# Patient Record
Sex: Male | Born: 1970 | Race: White | Hispanic: No | Marital: Single | Smoking: Never smoker
Health system: Southern US, Community
[De-identification: ages and names within clinical notes are randomized; demographics above are authoritative.]

## PROBLEM LIST (undated history)

## (undated) DIAGNOSIS — R918 Other nonspecific abnormal finding of lung field: Secondary | ICD-10-CM

## (undated) DIAGNOSIS — M109 Gout, unspecified: Secondary | ICD-10-CM

## (undated) DIAGNOSIS — Z8619 Personal history of other infectious and parasitic diseases: Secondary | ICD-10-CM

## (undated) DIAGNOSIS — J45909 Unspecified asthma, uncomplicated: Secondary | ICD-10-CM

## (undated) DIAGNOSIS — R7303 Prediabetes: Secondary | ICD-10-CM

## (undated) DIAGNOSIS — J189 Pneumonia, unspecified organism: Secondary | ICD-10-CM

## (undated) DIAGNOSIS — R3129 Other microscopic hematuria: Secondary | ICD-10-CM

## (undated) DIAGNOSIS — E291 Testicular hypofunction: Secondary | ICD-10-CM

## (undated) DIAGNOSIS — S0291XA Unspecified fracture of skull, initial encounter for closed fracture: Secondary | ICD-10-CM

## (undated) DIAGNOSIS — K219 Gastro-esophageal reflux disease without esophagitis: Secondary | ICD-10-CM

## (undated) DIAGNOSIS — Z8719 Personal history of other diseases of the digestive system: Secondary | ICD-10-CM

## (undated) DIAGNOSIS — G4730 Sleep apnea, unspecified: Secondary | ICD-10-CM

## (undated) DIAGNOSIS — S42309B Unspecified fracture of shaft of humerus, unspecified arm, initial encounter for open fracture: Secondary | ICD-10-CM

## (undated) HISTORY — DX: Gastro-esophageal reflux disease without esophagitis: K21.9

## (undated) HISTORY — DX: Gout, unspecified: M10.9

## (undated) HISTORY — DX: Unspecified asthma, uncomplicated: J45.909

## (undated) HISTORY — DX: Morbid (severe) obesity due to excess calories: E66.01

## (undated) HISTORY — DX: Pneumonia, unspecified organism: J18.9

## (undated) HISTORY — DX: Testicular hypofunction: E29.1

## (undated) HISTORY — DX: Personal history of other infectious and parasitic diseases: Z86.19

## (undated) HISTORY — DX: Other microscopic hematuria: R31.29

## (undated) HISTORY — DX: Unspecified fracture of skull, initial encounter for closed fracture: S02.91XA

## (undated) HISTORY — DX: Other nonspecific abnormal finding of lung field: R91.8

## (undated) HISTORY — DX: Unspecified fracture of shaft of humerus, unspecified arm, initial encounter for open fracture: S42.309B

---
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
## 1997-07-19 HISTORY — PX: VASECTOMY: SHX75

## 2009-07-19 DIAGNOSIS — S0291XA Unspecified fracture of skull, initial encounter for closed fracture: Secondary | ICD-10-CM

## 2009-07-19 DIAGNOSIS — S42309B Unspecified fracture of shaft of humerus, unspecified arm, initial encounter for open fracture: Secondary | ICD-10-CM

## 2009-07-19 DIAGNOSIS — R918 Other nonspecific abnormal finding of lung field: Secondary | ICD-10-CM

## 2009-07-19 HISTORY — DX: Unspecified fracture of shaft of humerus, unspecified arm, initial encounter for open fracture: S42.309B

## 2009-07-19 HISTORY — DX: Other nonspecific abnormal finding of lung field: R91.8

## 2009-07-19 HISTORY — DX: Unspecified fracture of skull, initial encounter for closed fracture: S02.91XA

## 2009-07-19 HISTORY — PX: ORIF HUMERUS FRACTURE: SHX2126

## 2011-08-05 ENCOUNTER — Encounter: Payer: Self-pay | Admitting: Family Medicine

## 2011-08-05 ENCOUNTER — Ambulatory Visit (INDEPENDENT_AMBULATORY_CARE_PROVIDER_SITE_OTHER): Payer: Commercial Managed Care - PPO | Admitting: Family Medicine

## 2011-08-05 VITALS — BP 118/84 | HR 76 | Temp 98.1°F | Ht 73.0 in | Wt 338.2 lb

## 2011-08-05 DIAGNOSIS — Z0000 Encounter for general adult medical examination without abnormal findings: Secondary | ICD-10-CM

## 2011-08-05 DIAGNOSIS — R918 Other nonspecific abnormal finding of lung field: Secondary | ICD-10-CM | POA: Insufficient documentation

## 2011-08-05 DIAGNOSIS — E669 Obesity, unspecified: Secondary | ICD-10-CM

## 2011-08-05 DIAGNOSIS — N508 Other specified disorders of male genital organs: Secondary | ICD-10-CM

## 2011-08-05 DIAGNOSIS — F5232 Male orgasmic disorder: Secondary | ICD-10-CM

## 2011-08-05 NOTE — Assessment & Plan Note (Signed)
Reviewed preventative protocols, updated unless pt declined. Encouraged seat belt use and weight loss.

## 2011-08-05 NOTE — Assessment & Plan Note (Signed)
Encouraged continued weight loss, increase exercise
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
## 2011-08-05 NOTE — Assessment & Plan Note (Signed)
As resolved, will monitor for now. Exam normal today. If returning sxs, return for further evaluation. S/p vasectomy.

## 2011-08-05 NOTE — Assessment & Plan Note (Signed)
Will obtain records from CT done at [facility].

## 2011-08-05 NOTE — Patient Instructions (Signed)
Good to meet you today. Return at your convenience fasting for blood work for physical. If having issues again, return to see [place]. Sign ROI for records from [facility]. Keep working on weight loss. Call us with questions.

## 2011-08-10 ENCOUNTER — Other Ambulatory Visit (INDEPENDENT_AMBULATORY_CARE_PROVIDER_SITE_OTHER): Payer: Commercial Managed Care - PPO

## 2011-08-10 DIAGNOSIS — Z0000 Encounter for general adult medical examination without abnormal findings: Secondary | ICD-10-CM

## 2011-08-10 LAB — COMPREHENSIVE METABOLIC PANEL
Albumin: 3.8 g/dL (ref 3.5–5.2)
BUN: 19 mg/dL (ref 6–23)
CO2: 28 mEq/L (ref 19–32)
Calcium: 9 mg/dL (ref 8.4–10.5)
Chloride: 106 mEq/L (ref 96–112)
Creatinine, Ser: 0.9 mg/dL (ref 0.4–1.5)
GFR: 94.19 mL/min (ref >60.00)
Glucose, Bld: 85 mg/dL (ref 70–99)
Potassium: 4.4 mEq/L (ref 3.5–5.1)

## 2011-08-10 LAB — LIPID PANEL
Cholesterol: 185 mg/dL (ref 0–200)
LDL Cholesterol: 116 mg/dL — ABNORMAL HIGH (ref 0–99)
Triglycerides: 110 mg/dL (ref 0.0–149.0)

## 2011-08-10 LAB — TSH: TSH: 1.68 u[IU]/mL (ref 0.35–5.50)

## 2011-09-08 ENCOUNTER — Encounter: Payer: Self-pay | Admitting: Family Medicine

## 2011-10-20 ENCOUNTER — Ambulatory Visit (INDEPENDENT_AMBULATORY_CARE_PROVIDER_SITE_OTHER): Payer: Commercial Managed Care - PPO | Admitting: Family Medicine

## 2011-10-20 ENCOUNTER — Encounter: Payer: Self-pay | Admitting: Family Medicine

## 2011-10-20 VITALS — BP 120/70 | HR 71 | Temp 98.4°F | Ht 73.0 in | Wt 333.8 lb

## 2011-10-20 DIAGNOSIS — M7020 Olecranon bursitis, unspecified elbow: Secondary | ICD-10-CM

## 2011-10-20 DIAGNOSIS — M7022 Olecranon bursitis, left elbow: Secondary | ICD-10-CM

## 2011-10-20 MED ORDER — CEPHALEXIN 500 MG PO CAPS: 1000.0000 mg | ORAL_CAPSULE | Freq: Two times a day (BID) | ORAL | Status: AC

## 2011-10-20 NOTE — Progress Notes (Signed)
  Patient Name: Brandon Parker Date of Birth: 1971/07/01 Age: 41 y.o. Medical Record Number: [id number] Gender: male Date of Encounter: 10/20/2011  History of Present Illness:  Brandon Parker is a 41 y.o. very pleasant male patient who presents with the following:  Had been sitting on couch with girlfriend. Not gotten smaller.   Left elbow 2 weeks ago became swollen posteriorly. No trauma or injury. No redness, warmth, or fever.[name]. No prior history of any significant trauma to the affected left elbow. He does have a remote history of gout per his report one time.  Past Medical History, Surgical History, Social History, Family History, Problem List, Medications, and Allergies have been reviewed and updated if relevant.  Review of Systems:  GEN: No fevers, chills. Nontoxic. Primarily MSK c/o today. MSK: Detailed in the HPI GI: tolerating PO intake without difficulty Neuro: No numbness, parasthesias, or tingling associated. Otherwise the pertinent positives of the ROS are noted above.    Physical Examination: Filed Vitals:   10/20/11 1410  BP: 120/70  Pulse: 71  Temp: 98.4 F (36.9 C)  TempSrc: Oral  Height: 6\' 1"  (1.854 m)  Weight: 333 lb 12.8 oz (151.411 kg)  SpO2: 98%    Body mass index is 44.04 kg/(m^2).   GEN: WDWN, NAD, Non-toxic, Alert & Oriented x 3 HEENT: Atraumatic, Normocephalic.  Ears and Nose: No external deformity. EXTR: No clubbing/cyanosis/edema NEURO: Normal gait.  PSYCH: Normally interactive. Conversant. Not depressed or anxious appearing.  Calm demeanor.   L elbow Ecchymosis or edema: neg ROM: full flexion, extension, pronation, supination Shoulder ROM: Full Flexion: 5/5 Extension: 5/5 Supination: 5/5  Pronation: 5/5 Wrist ext: 5/5 Wrist flexion: 5/5 No gross bony abnormality Swollen olecranon bursa Lateral epicondyle, resisted wrist extension from wrist full pronation and flexion: NT grip: 5/5  sensation intact Tinel's, Elbow:  negative   Assessment and Plan: 1. Olecranon bursitis of left elbow     Olecranon Bursa Aspiration, LEFT Verbal consent was obtained. Risks, benefits, and alternatives discussed. Potential complications including loss of pigment and atrophy were discussed and risk of infection. Discussed with the patient that risk of infection is higher with injection of corticosteroid into this superficial bursa. Prepped with Chloraprep x 2 and Ethyl Chloride used for anesthesia. Under sterile conditions, the effected olecranon bursa was aspirated with an 18 gauge needle. 10 cc of serosanguinous fluid was aspirated. 1/2 cc Lidocaine 1% and 1/2 cc of Depo-Medrol 40 mg was injected  A compression bandage was applied and the patient was instructed to continue for 72 hours.  Proph Keflex x 7 days given higher risk of infection with this aspiration

## 2011-12-09 ENCOUNTER — Encounter: Payer: Self-pay | Admitting: Family Medicine

## 2011-12-09 ENCOUNTER — Other Ambulatory Visit: Payer: Self-pay | Admitting: Family Medicine

## 2011-12-09 ENCOUNTER — Ambulatory Visit (INDEPENDENT_AMBULATORY_CARE_PROVIDER_SITE_OTHER): Payer: Commercial Managed Care - PPO | Admitting: Family Medicine

## 2011-12-09 VITALS — BP 112/70 | HR 68 | Temp 98.5°F | Wt 318.8 lb

## 2011-12-09 DIAGNOSIS — M7020 Olecranon bursitis, unspecified elbow: Secondary | ICD-10-CM

## 2011-12-09 DIAGNOSIS — M7022 Olecranon bursitis, left elbow: Secondary | ICD-10-CM

## 2011-12-09 NOTE — Patient Instructions (Addendum)
Olecranon bursa drained today, and steroid injected to hopefully prevent recurrence. Good to see you today, call us with questions. Compression bandage on for 3 days, limit activity of that elbow. Let us know right away if any fever, redness or warmth.  Olecranon Bursitis Bursitis is swelling and soreness (inflammation) of a fluid-filled sac (bursa) that covers and protects a joint. Olecranon bursitis occurs over the elbow.  CAUSES Bursitis can be caused by injury, overuse of the joint, arthritis, or infection.  SYMPTOMS   Tenderness, swelling, warmth, or redness over the elbow.   Elbow pain with movement. This is greater with bending the elbow.   Squeaking sound when the bursa is rubbed or moved.   Increasing size of the bursa without pain or discomfort.   Fever with increasing pain and swelling if the bursa becomes infected.  HOME CARE INSTRUCTIONS   Put ice on the affected area.   Put ice in a plastic bag.   Place a towel between your skin and the bag.   Leave the ice on for 15 to 20 minutes each hour while awake. Do this for the first 2 days.   When resting, elevate your elbow above the level of your heart. This helps reduce swelling.   Continue to put the joint through a full range of motion 4 times per day. Rest the injured joint at other times. When the pain lessens, begin normal slow movements and usual activities.   Only take over-the-counter or prescription medicines for pain, discomfort, or fever as directed by your caregiver.   Reduce your intake of milk and related dairy products (cheese, yogurt). They may make your condition worse.  SEEK IMMEDIATE MEDICAL CARE IF:   Your pain increases even during treatment.   You have a fever.   You have heat and inflammation over the bursa and elbow.   You have a red line that goes up your arm.   You have pain with movement of your elbow.  MAKE SURE YOU:   Understand these instructions.   Will watch your condition.     Will get help right away if you are not doing well or get worse.  Document Released: 08/04/2006 Document Revised: 06/24/2011 Document Reviewed: 06/20/2007 [facility] Patient Information 2012 [place], [place].

## 2011-12-09 NOTE — Assessment & Plan Note (Signed)
Serosanguinous fluid - not sent off for analysis.  anticipate traumatic bursitis. Aspiration and injection of steroid/lidocaine performed today. Pt tolerated well. Discussed risks of infection with steroid injection as well as red flags to seek urgent care. If not better, consider ortho referral for surgery.

## 2011-12-09 NOTE — Progress Notes (Signed)
  Subjective:    Patient ID: Brandon Parker, male    DOB: 04-29-1971, 41 y.o.   MRN: [id number]  HPI CC: olecranon bursitis  More strenuous work this past weekend, may have hit elbow on furniture and care.  Developed fluid in bursa over last 5 days again.  More swollen than last time.  No pain.  No warmth, redness, no fevers/chills.  Aspiration and steroid injection performed 10/20/2011 by Dr. Salena Saner.  Kept compression bandage on for 4 days.  Never had bursitis prior to April.  Review of Systems Per HPi    Objective:   Physical Exam  Nursing note and vitals reviewed. Musculoskeletal:       Left swollen olecranon bursa.  No warmth or erythema or pain.  FROM at elbow   IC obtained and in chart.  Area prepped with betadine, anesthesia with ethyl chloride.  1.5 inch 21g needle attached to 10cc syringe used to aspirate fluid.  7cc of serosanguinous appearing fluid aspirated, not cloudy.  Then using 10cc syringe and same needle, injected 1/2 cc of Depo-Medrol 40 mg and 1/2 cc of 1% lidocaine.  Pt tolerated procedure well.  Dressed in compression bandage.    Assessment & Plan:

## 2011-12-21 ENCOUNTER — Ambulatory Visit (INDEPENDENT_AMBULATORY_CARE_PROVIDER_SITE_OTHER): Payer: Commercial Managed Care - PPO | Admitting: Family Medicine

## 2011-12-21 ENCOUNTER — Encounter: Payer: Self-pay | Admitting: Family Medicine

## 2011-12-21 VITALS — BP 110/78 | HR 89 | Temp 98.1°F | Wt 315.8 lb

## 2011-12-21 DIAGNOSIS — J45909 Unspecified asthma, uncomplicated: Secondary | ICD-10-CM

## 2011-12-21 DIAGNOSIS — J209 Acute bronchitis, unspecified: Secondary | ICD-10-CM | POA: Insufficient documentation

## 2011-12-21 MED ORDER — PREDNISONE 20 MG PO TABS: ORAL_TABLET | ORAL | Status: DC

## 2011-12-21 MED ORDER — DEXAMETHASONE SODIUM PHOSPHATE 10 MG/ML IJ SOLN
10.0000 mg | Freq: Once | INTRAMUSCULAR | Status: AC
  Administered 2011-12-21: 10 mg via INTRAMUSCULAR

## 2011-12-21 MED ORDER — ALBUTEROL SULFATE HFA 108 (90 BASE) MCG/ACT IN AERS: 2.0000 | INHALATION_SPRAY | Freq: Four times a day (QID) | RESPIRATORY_TRACT | Status: DC | PRN

## 2011-12-21 MED ORDER — AZITHROMYCIN 250 MG PO TABS: ORAL_TABLET | ORAL | Status: AC

## 2011-12-21 NOTE — Progress Notes (Signed)
  Subjective:    Patient ID: Brandon Parker, male    DOB: [birth year], 41 y.o.   MRN: [id number]  HPI CC: cough  1 d h/o cough, congestion. Coughing up clear mucous.  Nonstop cough for 1 hour last night.  Now very congested in sinuses.  Trouble breathing.  Feels some feverish.  + frontal HA described as pressure.  Some PNdrainage.   + chest tightness.  Inhaler in past hasn't helped.  Took OTC sinutab, nyquil.  No fevers/chills, abd pain, n/v, ear/tooth pain, ST.  Denies chest pain.    No sick contacts at home.  No smokers at home.  Has been told has asthma in past but only with colds.  eval by pulm, who didn't think he had asthma.  This evaluation was 10-12 years ago.  3-4 wks ago used bleach to clean kitchen floor.  Wt Readings from Last 3 Encounters:  12/21/11 315 lb 12 oz (143.223 kg)  12/09/11 318 lb 12 oz (144.584 kg)  10/20/11 333 lb 12.8 oz (151.411 kg)    Past Medical History  Diagnosis Date  . History of chicken pox   . Esophagitis 1992  . Fracture, humerus, open 2011  . Skull fracture 2011  . Obesity   . Pulmonary nodules 2011    per CT scan [facility] - reviewed records and no mention of this    Review of Systems Per HPI    Objective:   Physical Exam  Nursing note and vitals reviewed. Constitutional: He appears well-developed and well-nourished. No distress.  HENT:  Head: Normocephalic and atraumatic.  Right Ear: Hearing, tympanic membrane, external ear and ear canal normal.  Left Ear: Hearing, external ear and ear canal normal.  Nose: No mucosal edema or rhinorrhea. Right sinus exhibits no maxillary sinus tenderness and no frontal sinus tenderness. Left sinus exhibits no maxillary sinus tenderness and no frontal sinus tenderness.  Mouth/Throat: Uvula is midline and mucous membranes are normal. Posterior oropharyngeal erythema present. No oropharyngeal exudate, posterior oropharyngeal edema or tonsillar abscesses.       L superior TM erythematous, retracted, some  fluid behind TM 2+ tonsillar swelling present bilaterally  Eyes: Conjunctivae and EOM are normal. Pupils are equal, round, and reactive to light. No scleral icterus.  Neck: Normal range of motion. Neck supple.  Cardiovascular: Normal rate, regular rhythm, normal heart sounds and intact distal pulses.   No murmur heard. Pulmonary/Chest: Effort normal. No respiratory distress. He has wheezes. He has no rales.       Rhonchi and wheezing inspiratory/expiratory greatest at RLL.  However most of wheezing stems from upper airway transmission. Cough present  Musculoskeletal: He exhibits no edema.  Lymphadenopathy:    He has no cervical adenopathy.  Skin: Skin is warm and dry. No rash noted.  Psychiatric: He has a normal mood and affect.      Assessment & Plan:

## 2011-12-21 NOTE — Assessment & Plan Note (Signed)
Anticipate asthmatic bronchitis with significant upper airway transmission of wheezing from some pharyngeal erythema/edema. Significant RAD component, recent bleach exposure may be contributing. Also with what looks like left serous otitis without symptoms currently. Treat aggressively with shot of dexamethasone 10mg  IM today, prednisone course, albuterol inhaler to use (discussd technique) and zpack. Discussed steroid precautions. Take mucinex at home. If not improving or any worsening, to return here or seek urgent care.

## 2011-12-21 NOTE — Patient Instructions (Signed)
You have asthmatic bronchitis, or at least significant airway reactivity with upper respiratory infection. Shot of steroids today.  Take prednisone course starting tonight for 10 days.  zpack for 5 days and albuterol as needed. Simple mucinex with plenty of fluid to mobilize mucous out. If not improving as expected, or any worsening, please seek urgent care or return to see [place].

## 2012-07-28 ENCOUNTER — Encounter: Payer: Self-pay | Admitting: Family Medicine

## 2012-07-28 ENCOUNTER — Ambulatory Visit (INDEPENDENT_AMBULATORY_CARE_PROVIDER_SITE_OTHER): Payer: Commercial Managed Care - PPO | Admitting: Family Medicine

## 2012-07-28 VITALS — BP 126/82 | HR 84 | Temp 98.2°F | Wt 334.2 lb

## 2012-07-28 DIAGNOSIS — R062 Wheezing: Secondary | ICD-10-CM

## 2012-07-28 DIAGNOSIS — J019 Acute sinusitis, unspecified: Secondary | ICD-10-CM

## 2012-07-28 MED ORDER — AMOXICILLIN-POT CLAVULANATE 875-125 MG PO TABS: 1.0000 | ORAL_TABLET | Freq: Two times a day (BID) | ORAL | Status: AC

## 2012-07-28 MED ORDER — FLUTICASONE PROPIONATE 50 MCG/ACT NA SUSP: 2.0000 | Freq: Every day | NASAL | Status: DC

## 2012-07-28 NOTE — Progress Notes (Signed)
  Subjective:    Patient ID: Brandon Parker, male    DOB: 1971/07/14, 42 y.o.   MRN: [id number]  HPI CC: sinus drainage  6-7d h/o sinus drainage, PNdrainage.  + sinus pressure HA frontally and under eyes.  Constantly coughing 2/2 drainage.  Yesterday when awoke, noticed phlegm turning darker.  Every morning coughing and bringing productive mucous up.  + nasal congestion.    So far has tried antihistamine.  No fevers/chills, abd pain, ST, ear or tooth pain.  No SOB.  No sick contacts at home. No h/o asthma/allergic rhinitis.  Has been told has upper airway related wheezing in past.  Wheezing not responsive to bronchodilator. No smokers at home.  Past Medical History  Diagnosis Date  . History of chicken pox   . Esophagitis 1992  . Fracture, humerus, open 2011  . Skull fracture 2011  . Obesity   . Pulmonary nodules 2011    per CT scan [facility] - reviewed records and no mention of this     Review of Systems Per HPI    Objective:   Physical Exam  Nursing note and vitals reviewed. Constitutional: He appears well-developed and well-nourished. No distress.  HENT:  Head: Normocephalic and atraumatic.  Right Ear: Hearing, tympanic membrane, external ear and ear canal normal.  Left Ear: Hearing, tympanic membrane, external ear and ear canal normal.  Nose: Mucosal edema present. No rhinorrhea. Right sinus exhibits no maxillary sinus tenderness and no frontal sinus tenderness. Left sinus exhibits no maxillary sinus tenderness and no frontal sinus tenderness.  Mouth/Throat: Uvula is midline and mucous membranes are normal. Posterior oropharyngeal erythema present. No oropharyngeal exudate, posterior oropharyngeal edema or tonsillar abscesses.       Nasal sinus congestion  Eyes: Conjunctivae normal and EOM are normal. Pupils are equal, round, and reactive to light. No scleral icterus.  Neck: Normal range of motion. Neck supple.  Cardiovascular: Normal rate, regular rhythm, normal heart  sounds and intact distal pulses.   No murmur heard. Pulmonary/Chest: Effort normal. No respiratory distress. He has wheezes (insp/exp with some upper airway transmission). He has no rales.  Lymphadenopathy:    He has no cervical adenopathy.  Skin: Skin is warm and dry. No rash noted.       Assessment & Plan:

## 2012-07-28 NOTE — Assessment & Plan Note (Signed)
Per pt no h/o asthma, has been evaluated by pulm in past, told soft palate issue - upper airway that should improve with weight loss. Denies SOB, states wheezing not responsive to bronchodilator in past. There is some wheezing in lungs today - did recommend start albuterol regularly for next 3 days.

## 2012-07-28 NOTE — Assessment & Plan Note (Signed)
Given short duration discussed possible still viral etiology. Supportive care as per instructions. Treat with mucinex, nasal saline, nasal steroid, and albuterol. Discussed reasons to fill abx.

## 2012-07-28 NOTE — Patient Instructions (Addendum)
You have a sinus infection, likely still viral. Take medicine as prescribed: flonase for inflammation in sinuses.  May use ibuprofen as well for sinus inflammation and headache. Push fluids and plenty of rest. Nasal saline irrigation or neti pot to help drain sinuses. May use simple mucinex with plenty of fluid to help mobilize mucous. If not improving over weekend or any worsening, fill antibiotic (script sent into pharmacy). Use albuterol regularly for next 3 days Let us know if fever >101.5, trouble opening/closing mouth, difficulty swallowing, or worsening - you may need to be seen again.

## 2012-08-04 ENCOUNTER — Telehealth: Payer: Self-pay | Admitting: Family Medicine

## 2012-08-04 MED ORDER — GUAIFENESIN-CODEINE 100-10 MG/5ML PO SYRP: 5.0000 mL | ORAL_SOLUTION | Freq: Two times a day (BID) | ORAL | Status: DC | PRN

## 2012-08-04 MED ORDER — PREDNISONE 20 MG PO TABS: ORAL_TABLET | ORAL | Status: DC

## 2012-08-04 NOTE — Telephone Encounter (Signed)
Pt was seen last week for sinus infection.  Started Augmentin on Sunday but is now experiencing worsened congestion and cough.  No fever.  Please advise.

## 2012-08-04 NOTE — Telephone Encounter (Signed)
Spoke with patient - started feeling better over this week after starting augmentin course.  Over last 2-3 days having worsening cough/congestion - rib pain from coughing.  Improved head congestion. Wheezing and SOB about the same. Will treat with prednisone course - concern for asthmatic bronchitis. Selena Batten can we call in cough med for pt? Thanks.

## 2012-08-04 NOTE — Telephone Encounter (Signed)
Rx called in as directed.   

## 2012-08-28 ENCOUNTER — Telehealth: Payer: Self-pay | Admitting: *Deleted

## 2012-08-28 ENCOUNTER — Ambulatory Visit (INDEPENDENT_AMBULATORY_CARE_PROVIDER_SITE_OTHER): Payer: Commercial Managed Care - PPO | Admitting: Family Medicine

## 2012-08-28 ENCOUNTER — Encounter: Payer: Self-pay | Admitting: Family Medicine

## 2012-08-28 VITALS — BP 136/82 | HR 80 | Temp 98.3°F | Wt 340.5 lb

## 2012-08-28 DIAGNOSIS — M109 Gout, unspecified: Secondary | ICD-10-CM | POA: Insufficient documentation

## 2012-08-28 MED ORDER — COLCHICINE 0.6 MG PO TABS: 0.6000 mg | ORAL_TABLET | Freq: Two times a day (BID) | ORAL | Status: DC | PRN

## 2012-08-28 NOTE — Telephone Encounter (Signed)
Opened in error

## 2012-08-28 NOTE — Assessment & Plan Note (Signed)
Podagra. Treat with colchicine as directed in instructions. Continue ibuprofen as needed. If recurrent, discussed ppx med. Provided with gout handout. Lab Results  Component Value Date   CREATININE 0.9 08/10/2011

## 2012-08-28 NOTE — Progress Notes (Signed)
  Subjective:    Patient ID: Brandon Parker, male    DOB: [birth year], 42 y.o.   MRN: [id number]  HPI CC: ?gout  States h/o gout in past in his twenties.  2d h/o R 1st MTP joint pain. Progressively enlarging and more sore, at night tender to rub bed sheet against toe.   This feels just like prior gout flare.  Started drinking cherry juice, using ibuprofen, changed diet from red meat to chicken.  Denies fevers/chills, nausea.  No rashes he's noted. Denies ankle or knee or other joint pains. H/o achilles tendinopathy. No results found for this basename: URICACID   Did drink more recently.  Has been going to [employer].  Past Medical History  Diagnosis Date  . History of chicken pox   . Esophagitis 1992  . Fracture, humerus, open 2011  . Skull fracture 2011  . Obesity   . Pulmonary nodules 2011    per CT scan [facility] - reviewed records and no mention of this  . Gout      Review of Systems Per HPI    Objective:   Physical Exam  Nursing note and vitals reviewed. Constitutional: He appears well-developed and well-nourished. No distress.  Musculoskeletal: He exhibits no edema.  L MTP WNL R foot - swelling and pain and redness at 1st MTPJ FROM at ankle. 2+ DP bilaterally  Skin: Skin is warm and dry. There is erythema.       Assessment & Plan:

## 2012-08-28 NOTE — Patient Instructions (Signed)
You do have gout flare - treat with ibuprofen and colchicine - take 2 colchicine pills today and then one twice daily as needed, likely no more than 3-4 days. If recurrent gout flares, let me know to discusss daily gout prevention medication.  Gout Gout is an inflammatory condition (arthritis) caused by a buildup of uric acid crystals in the joints. Uric acid is a chemical that is normally present in the blood. Under some circumstances, uric acid can form into crystals in your joints. This causes joint redness, soreness, and swelling (inflammation). Repeat attacks are common. Over time, uric acid crystals can form into masses (tophi) near a joint, causing disfigurement. Gout is treatable and often preventable. CAUSES  The disease begins with elevated levels of uric acid in the blood. Uric acid is produced by your body when it breaks down a naturally found substance called purines. This also happens when you eat certain foods such as meats and fish. Causes of an elevated uric acid level include:  Being passed down from parent to child (heredity).  Diseases that cause increased uric acid production (obesity, psoriasis, some cancers).  Excessive alcohol use.  Diet, especially diets rich in meat and seafood.  Medicines, including certain cancer-fighting drugs (chemotherapy), diuretics, and aspirin.  Chronic kidney disease. The kidneys are no longer able to remove uric acid well.  Problems with metabolism. Conditions strongly associated with gout include:  Obesity.  High blood pressure.  High cholesterol.  Diabetes. Not everyone with elevated uric acid levels gets gout. It is not understood why some people get gout and others do not. Surgery, joint injury, and eating too much of certain foods are some of the factors that can lead to gout. SYMPTOMS   An attack of gout comes on quickly. It causes intense pain with redness, swelling, and warmth in a joint.  Fever can occur.  Often, only  one joint is involved. Certain joints are more commonly involved:  Base of the big toe.  Knee.  Ankle.  Wrist.  Finger. Without treatment, an attack usually goes away in a few days to weeks. Between attacks, you usually will not have symptoms, which is different from many other forms of arthritis. DIAGNOSIS  Your caregiver will suspect gout based on your symptoms and exam. Removal of fluid from the joint (arthrocentesis) is done to check for uric acid crystals. Your caregiver will give you a medicine that numbs the area (local anesthetic) and use a needle to remove joint fluid for exam. Gout is confirmed when uric acid crystals are seen in joint fluid, using a special microscope. Sometimes, blood, urine, and X-ray tests are also used. TREATMENT  There are 2 phases to gout treatment: treating the sudden onset (acute) attack and preventing attacks (prophylaxis). Treatment of an Acute Attack  Medicines are used. These include anti-inflammatory medicines or steroid medicines.  An injection of steroid medicine into the affected joint is sometimes necessary.  The painful joint is rested. Movement can worsen the arthritis.  You may use warm or cold treatments on painful joints, depending which works best for you.  Discuss the use of coffee, vitamin C, or cherries with your caregiver. These may be helpful treatment options. Treatment to Prevent Attacks After the acute attack subsides, your caregiver may advise prophylactic medicine. These medicines either help your kidneys eliminate uric acid from your body or decrease your uric acid production. You may need to stay on these medicines for a very long time. The early phase of treatment with  prophylactic medicine can be associated with an increase in acute gout attacks. For this reason, during the first few months of treatment, your caregiver may also advise you to take medicines usually used for acute gout treatment. Be sure you understand your  caregiver's directions. You should also discuss dietary treatment with your caregiver. Certain foods such as meats and fish can increase uric acid levels. Other foods such as dairy can decrease levels. Your caregiver can give you a list of foods to avoid. HOME CARE INSTRUCTIONS   Do not take aspirin to relieve pain. This raises uric acid levels.  Only take over-the-counter or prescription medicines for pain, discomfort, or fever as directed by your caregiver.  Rest the joint as much as possible. When in bed, keep sheets and blankets off painful areas.  Keep the affected joint raised (elevated).  Use crutches if the painful joint is in your leg.  Drink enough water and fluids to keep your urine clear or pale yellow. This helps your body get rid of uric acid. Do not drink alcoholic beverages. They slow the passage of uric acid.  Follow your caregiver's dietary instructions. Pay careful attention to the amount of protein you eat. Your daily diet should emphasize fruits, vegetables, whole grains, and fat-free or low-fat milk products.  Maintain a healthy body weight. SEEK MEDICAL CARE IF:   You have an oral temperature above 102 F (38.9 C).  You develop diarrhea, vomiting, or any side effects from medicines.  You do not feel better in 24 hours, or you are getting worse. SEEK IMMEDIATE MEDICAL CARE IF:   Your joint becomes suddenly more tender and you have:  Chills.  An oral temperature above 102 F (38.9 C), not controlled by medicine. MAKE SURE YOU:   Understand these instructions.  Will watch your condition.  Will get help right away if you are not doing well or get worse. Document Released: 07/02/2000 Document Revised: 09/27/2011 Document Reviewed: 10/13/2009 [facility] Patient Information 2013 [place], [place].

## 2013-01-31 ENCOUNTER — Ambulatory Visit (INDEPENDENT_AMBULATORY_CARE_PROVIDER_SITE_OTHER): Payer: Commercial Managed Care - PPO | Admitting: Family Medicine

## 2013-01-31 ENCOUNTER — Encounter: Payer: Self-pay | Admitting: Family Medicine

## 2013-01-31 VITALS — BP 118/82 | HR 72 | Temp 97.9°F | Wt 356.8 lb

## 2013-01-31 DIAGNOSIS — J209 Acute bronchitis, unspecified: Secondary | ICD-10-CM

## 2013-01-31 DIAGNOSIS — W57XXXA Bitten or stung by nonvenomous insect and other nonvenomous arthropods, initial encounter: Secondary | ICD-10-CM

## 2013-01-31 MED ORDER — PREDNISONE 20 MG PO TABS: ORAL_TABLET | ORAL | Status: DC

## 2013-01-31 NOTE — Progress Notes (Signed)
  Subjective:    Patient ID: Brandon Parker, male    DOB: 01-Dec-1970, 42 y.o.   MRN: [id number]  HPI CC: ER f/u  Found large tick embedded in scrotum 01/17/2013.  Self removed.  Next day, felt dry and sore roof of mouth.  On Friday (01/19/2013) started having congestion, chills, dizziness.  Felt very weak.  Evaluated at [facility], placed on 2 wk course of doxycycline 01/19/2013.  Since then, having headache, nausea, dizziness and significant congestion with myalgia, fatigue.  Feels tight in neck and shoulders.  Cough productive of congestion.  Intermittent sore throat.  Denies wheeze or short winded.    Not currently using albuterol - didn't really seem to help.   Has taken oral steroids in past.  No fever.  No persistent rash.  No joint pains.  No ear pain or PNdrainage.   No sick contacts at home. No smokers at home. Did have recent mosquito bites.  Cheratussin helps with cough.  Past Medical History  Diagnosis Date  . History of chicken pox   . Esophagitis 1992  . Fracture, humerus, open 2011  . Skull fracture 2011  . Obesity   . Pulmonary nodules 2011    per CT scan [facility] - reviewed records and no mention of this  . Gout     Review of Systems Per HPI    Objective:   Physical Exam  Nursing note and vitals reviewed. Constitutional: He appears well-developed and well-nourished. No distress.  HENT:  Head: Normocephalic and atraumatic.  Right Ear: Hearing, tympanic membrane, external ear and ear canal normal.  Left Ear: Hearing, tympanic membrane, external ear and ear canal normal.  Nose: Nose normal. No mucosal edema or rhinorrhea. Right sinus exhibits no maxillary sinus tenderness and no frontal sinus tenderness. Left sinus exhibits no maxillary sinus tenderness and no frontal sinus tenderness.  Mouth/Throat: Uvula is midline, oropharynx is clear and moist and mucous membranes are normal. No oropharyngeal exudate, posterior oropharyngeal edema, posterior oropharyngeal  erythema or tonsillar abscesses.  Eyes: Conjunctivae and EOM are normal. Pupils are equal, round, and reactive to light. No scleral icterus.  Neck: Normal range of motion. Neck supple.  Cardiovascular: Normal rate, regular rhythm, normal heart sounds and intact distal pulses.   No murmur heard. Pulmonary/Chest: Effort normal. No respiratory distress. He has wheezes (diffuse melodic insp/exp wheezing). He has no rhonchi. He has no rales.  Abdominal: Soft. Normal appearance and bowel sounds are normal. He exhibits no distension and no mass. There is no hepatosplenomegaly. There is no tenderness. There is no rigidity, no rebound, no guarding, no CVA tenderness and negative Murphy's sign.  Musculoskeletal: He exhibits no edema.  Lymphadenopathy:    He has no cervical adenopathy.  Skin: Skin is warm and dry. No rash noted.       Assessment & Plan:

## 2013-01-31 NOTE — Assessment & Plan Note (Signed)
Significant RAD component - treat with steroid course and albuterol scheduled for next 1-2 days. Doubt further infection so will not add second antibiotic.  Recommended finish doxycycline. Use guaifenesin OTC as well. Continue cheratussin. If not improving or any worsening, return for further evaluation.

## 2013-01-31 NOTE — Assessment & Plan Note (Signed)
Recent tick bite - currently healing well.  S/p 2wk treatment with doxy per ED physician at [facility].   Likely is covered for any tick borne illness with this treatment.  reassured.

## 2013-01-31 NOTE — Patient Instructions (Signed)
I think we have any possible tick borne illness covered with doxycycline 2 weeks. You have significant wheezing in lungs - treat with steroid course as prescribed. May continue cheratussin as needed. Start albuterol inhaler 2 puffs regularly twice daily for next 2 days to see if improvement in cough. May use plain mucinex or immediate release guaifenesin with lots of water to help mobilize mucous. Let me know if fever develops >101 or worsening productive cough despite above treatment

## 2013-07-19 DIAGNOSIS — E291 Testicular hypofunction: Secondary | ICD-10-CM

## 2013-07-19 HISTORY — DX: Testicular hypofunction: E29.1

## 2013-09-10 ENCOUNTER — Ambulatory Visit (INDEPENDENT_AMBULATORY_CARE_PROVIDER_SITE_OTHER)
Admission: RE | Admit: 2013-09-10 | Discharge: 2013-09-10 | Disposition: A | Discharge status: Home / Self Care | Payer: Commercial Managed Care - PPO | Source: Ambulatory Visit | Attending: Family Medicine | Admitting: Family Medicine

## 2013-09-10 ENCOUNTER — Ambulatory Visit (INDEPENDENT_AMBULATORY_CARE_PROVIDER_SITE_OTHER): Payer: Commercial Managed Care - PPO | Admitting: Family Medicine

## 2013-09-10 ENCOUNTER — Encounter: Payer: Self-pay | Admitting: Family Medicine

## 2013-09-10 VITALS — BP 122/80 | HR 72 | Temp 98.0°F | Wt 382.8 lb

## 2013-09-10 DIAGNOSIS — R635 Abnormal weight gain: Secondary | ICD-10-CM

## 2013-09-10 DIAGNOSIS — J209 Acute bronchitis, unspecified: Secondary | ICD-10-CM

## 2013-09-10 MED ORDER — PREDNISONE 20 MG PO TABS: ORAL_TABLET | ORAL | Status: DC

## 2013-09-10 MED ORDER — AZITHROMYCIN 250 MG PO TABS
ORAL_TABLET | ORAL | Status: DC
Start: 2013-09-10 — End: 2013-11-29

## 2013-09-10 NOTE — Progress Notes (Signed)
Pre visit review using our clinic review tool, if applicable. No additional management support is needed unless otherwise documented below in the visit note. 

## 2013-09-10 NOTE — Progress Notes (Signed)
BP 122/80  Pulse 72  Temp(Src) 98 F (36.7 C) (Oral)  Wt 382 lb 12 oz (173.614 kg)  SpO2 97%   CC: cough  Subjective:    Patient ID: Brandon Parker, male    DOB: October 02, 1970, 43 y.o.   MRN: [id number]  HPI: Brandon Parker is a 43 y.o. male presenting on 09/10/2013 with Cough   5wk h/o intermittent coughing episodes.  Started with fevers/chills, bad hacking cough, sore throat, wheezing and dypsnea for 2 weeks.  Hoarseness.  Albuterol only mildly helped.  Coughing fits with trouble catching breath.  Increased wheezing and dyspnea from baseline. Had a good weekend, then 2d ago started having recurrent coughing fit.  May have been exposed to mold when this coughing fit started. Cough present especially every morning after he awakens and takes hot shower. Breathing cold air improves his breathing.  Does well during the day, then cough returns at night and whenever he comes into a warm environment. Tickle in back of throat.  No current fevers/chills, abd pain, nausea, ear or tooth pain, sore throat. Currently taking mucinex DM, albuterol inhaler.  Not currently taking flonase.  No sick contacts at home. Never smoker No flu shot this year. Has not had Tdap.  Relevant past medical, surgical, family and social history reviewed and updated as indicated.  Allergies and medications reviewed and updated. Current Outpatient Prescriptions on File Prior to Visit  Medication Sig  . albuterol (PROVENTIL HFA;VENTOLIN HFA) 108 (90 BASE) MCG/ACT inhaler Inhale 2 puffs into the lungs every 6 (six) hours as needed for wheezing.  . colchicine 0.6 MG tablet Take 1 tablet (0.6 mg total) by mouth 2 (two) times daily as needed.  . fluticasone (FLONASE) 50 MCG/ACT nasal spray Place 2 sprays into the nose daily.   No current facility-administered medications on file prior to visit.    Review of Systems Per HPI unless specifically indicated above    Objective:    BP 122/80  Pulse 72  Temp(Src) 98 F  (36.7 C) (Oral)  Wt 382 lb 12 oz (173.614 kg)  SpO2 97%  Physical Exam  Nursing note and vitals reviewed. Constitutional: He appears well-developed and well-nourished. No distress.  HENT:  Head: Normocephalic and atraumatic.  Right Ear: Hearing, tympanic membrane, external ear and ear canal normal.  Left Ear: Hearing, tympanic membrane, external ear and ear canal normal.  Nose: Nose normal. No mucosal edema or rhinorrhea. Right sinus exhibits no maxillary sinus tenderness and no frontal sinus tenderness. Left sinus exhibits no maxillary sinus tenderness and no frontal sinus tenderness.  Mouth/Throat: Uvula is midline, oropharynx is clear and moist and mucous membranes are normal. No oropharyngeal exudate, posterior oropharyngeal edema, posterior oropharyngeal erythema or tonsillar abscesses.  Eyes: Conjunctivae and EOM are normal. Pupils are equal, round, and reactive to light. No scleral icterus.  Neck: Normal range of motion. Neck supple.  Cardiovascular: Normal rate, regular rhythm, normal heart sounds and intact distal pulses.   No murmur heard. Pulmonary/Chest: Effort normal. No respiratory distress. He has no decreased breath sounds. He has wheezes (exp wheezing bilaterally). He has rhonchi (scattered). He has no rales.  Lymphadenopathy:    He has no cervical adenopathy.  Skin: Skin is warm and dry. No rash noted.       Assessment & Plan:   Problem List Items Addressed This Visit   Abnormal weight gain      Wt Readings from Last 3 Encounters:  09/10/13 382 lb 12 oz (173.614 kg)  01/31/13  356 lb 12 oz (161.821 kg)  08/28/12 340 lb 8 oz (154.45 kg)   Body mass index is 50.51 kg/(m^2).  Continued unexpected weight gain. 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  Will return fasting for blood work.    Acute bronchitis - Primary     Significant RAD component - anticipate asthma dx.  Pt will need spirometry when breathing better. Will check CXR for  f/u pulm nodules and for prolonged cough. Will treat with prednisone and zpack course. Treat cough with cheratussin.    Relevant Medications      predniSONE (DELTASONE) tablet      azithromycin (ZITHROMAX) tablet   Other Relevant Orders      DG Chest 2 View       Follow up plan: Return if symptoms worsen or fail to improve.

## 2013-09-10 NOTE — Assessment & Plan Note (Signed)
Wt Readings from Last 3 Encounters:  09/10/13 382 lb 12 oz (173.614 kg)  01/31/13 356 lb 12 oz (161.821 kg)  08/28/12 340 lb 8 oz (154.45 kg)   Body mass index is 50.51 kg/(m^2).  Continued unexpected weight gain. 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  Will return fasting for blood work.

## 2013-09-10 NOTE — Patient Instructions (Signed)
Good to see you today. Chest xray today. Treat bronchitis with antibiotic and steroid, continue inhaler as needed. ?asthma - may recheck lung function when you are feeling better. Return fasting for blood work and afterwards for physical.

## 2013-09-10 NOTE — Assessment & Plan Note (Signed)
Significant RAD component - anticipate asthma dx.  Pt will need spirometry when breathing better. Will check CXR for f/u pulm nodules and for prolonged cough. Will treat with prednisone and zpack course. Treat cough with cheratussin.

## 2013-11-22 ENCOUNTER — Other Ambulatory Visit: Payer: Self-pay | Admitting: Family Medicine

## 2013-11-22 ENCOUNTER — Other Ambulatory Visit (INDEPENDENT_AMBULATORY_CARE_PROVIDER_SITE_OTHER): Payer: Commercial Managed Care - PPO

## 2013-11-22 DIAGNOSIS — M109 Gout, unspecified: Secondary | ICD-10-CM

## 2013-11-22 DIAGNOSIS — E669 Obesity, unspecified: Secondary | ICD-10-CM

## 2013-11-22 DIAGNOSIS — Z0000 Encounter for general adult medical examination without abnormal findings: Secondary | ICD-10-CM

## 2013-11-22 LAB — COMPREHENSIVE METABOLIC PANEL
ALK PHOS: 77 U/L (ref 39–117)
ALT: 23 U/L (ref 0–53)
AST: 15 U/L (ref 0–37)
Albumin: 3.7 g/dL (ref 3.5–5.2)
BILIRUBIN TOTAL: 1 mg/dL (ref 0.2–1.2)
BUN: 17 mg/dL (ref 6–23)
CO2: 30 meq/L (ref 19–32)
Calcium: 9.1 mg/dL (ref 8.4–10.5)
Chloride: 104 mEq/L (ref 96–112)
Creatinine, Ser: 0.9 mg/dL (ref 0.4–1.5)
GFR: 96.71 mL/min (ref >60.00)
Glucose, Bld: 88 mg/dL (ref 70–99)
Potassium: 4.6 mEq/L (ref 3.5–5.1)
Sodium: 140 mEq/L (ref 135–145)
Total Protein: 7.5 g/dL (ref 6.0–8.3)

## 2013-11-22 LAB — URIC ACID: URIC ACID, SERUM: 8.5 mg/dL — AB (ref 4.0–7.8)

## 2013-11-22 LAB — LIPID PANEL
CHOL/HDL RATIO: 5
Cholesterol: 204 mg/dL — ABNORMAL HIGH (ref 0–200)
HDL: 40.3 mg/dL (ref >39.00)
LDL Cholesterol: 140 mg/dL — ABNORMAL HIGH (ref 0–99)
Triglycerides: 120 mg/dL (ref 0.0–149.0)
VLDL: 24 mg/dL (ref 0.0–40.0)

## 2013-11-29 ENCOUNTER — Encounter: Payer: Self-pay | Admitting: Family Medicine

## 2013-11-29 ENCOUNTER — Other Ambulatory Visit: Payer: Commercial Managed Care - PPO

## 2013-11-29 ENCOUNTER — Ambulatory Visit (INDEPENDENT_AMBULATORY_CARE_PROVIDER_SITE_OTHER): Payer: Commercial Managed Care - PPO | Admitting: Family Medicine

## 2013-11-29 VITALS — BP 134/90 | HR 76 | Temp 98.1°F | Ht 73.0 in | Wt 386.5 lb

## 2013-11-29 DIAGNOSIS — R21 Rash and other nonspecific skin eruption: Secondary | ICD-10-CM | POA: Insufficient documentation

## 2013-11-29 DIAGNOSIS — M109 Gout, unspecified: Secondary | ICD-10-CM

## 2013-11-29 DIAGNOSIS — Z0000 Encounter for general adult medical examination without abnormal findings: Secondary | ICD-10-CM

## 2013-11-29 DIAGNOSIS — R635 Abnormal weight gain: Secondary | ICD-10-CM

## 2013-11-29 DIAGNOSIS — R0981 Nasal congestion: Secondary | ICD-10-CM | POA: Insufficient documentation

## 2013-11-29 DIAGNOSIS — E669 Obesity, unspecified: Secondary | ICD-10-CM

## 2013-11-29 DIAGNOSIS — J3489 Other specified disorders of nose and nasal sinuses: Secondary | ICD-10-CM

## 2013-11-29 MED ORDER — CLOTRIMAZOLE-BETAMETHASONE 1-0.05 % EX CREA: 1.0000 "application " | TOPICAL_CREAM | Freq: Two times a day (BID) | CUTANEOUS | Status: DC

## 2013-11-29 NOTE — Assessment & Plan Note (Signed)
?  scarred skin from prior infection, vs intertrigo vs other.  Treat for 2 wks with lotrisone cream (sent to pharmacy.

## 2013-11-29 NOTE — Assessment & Plan Note (Signed)
Preventative protocols reviewed and updated unless pt declined. Discussed healthy diet and lifestyle.  

## 2013-11-29 NOTE — Progress Notes (Signed)
Pre visit review using our clinic review tool, if applicable. No additional management support is needed unless otherwise documented below in the visit note. 

## 2013-11-29 NOTE — Patient Instructions (Signed)
Return tomorrow am for blood work. Try zyrtec nightly for am congestion - trial of this for at least 2 weeks. Let me know if recurrent gout flares to discuss daily med to lower uric acid level.

## 2013-11-29 NOTE — Assessment & Plan Note (Signed)
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 We did not check blood work for this yet - will return tomorrow am for further evaluation Body mass index is 51 kg/(m^2).  Discussed importance of incorporating BOTH diet and healthy lifestyle changes for sustainable weight loss.

## 2013-11-29 NOTE — Assessment & Plan Note (Signed)
Predominantly in am - suggested trial of zytec nightly and update me with effect.

## 2013-11-29 NOTE — Assessment & Plan Note (Signed)
Reviewed with patient need for ppx medication of recurrent gout flares.

## 2013-11-29 NOTE — Assessment & Plan Note (Signed)
See above. Continue to ecourage weight loss attempts.

## 2013-11-29 NOTE — Progress Notes (Signed)
BP 134/90  Pulse 76  Temp(Src) 98.1 F (36.7 C) (Oral)  Ht 6\' 1"  (1.854 m)  Wt 386 lb 8 oz (175.315 kg)  BMI 51.00 kg/m2   CC: CPE  Subjective:    Patient ID: Brandon Parker, male    DOB: 08-24-1970, 43 y.o.   MRN: [id number]  HPI: Brandon Parker is a 43 y.o. male presenting on 11/29/2013 for Annual Exam   Persistent cough - especially in am when he sits and stands upright (no trouble if he stays in bed extra).  Coughing fits.  This is just in the am.  Denies h/o allergies.  BP Readings from Last 3 Encounters:  11/29/13 134/90  09/10/13 122/80  01/31/13 118/82   Wt Readings from Last 3 Encounters:  11/29/13 386 lb 8 oz (175.315 kg)  09/10/13 382 lb 12 oz (173.614 kg)  01/31/13 356 lb 12 oz (161.821 kg)  Body mass index is 51 kg/(m^2).  Preventative: Flu shot - declines.  Tetanus shot - thinks around 2006.  No fmhx prostate problems. No nocturia. Strong stream.  Doesn't use seat belt at all.  Sunscreen use - does not use.  denies suspicious moles.  Caffeine: [employer] energy 2-3/day  Lives with mother, daughter, son  Stays with GF.  Occupation: Doctor, hospital  Activity: occasional exercise bike  Diet: good water, seldom fruits/vegetables, good protein, 1600 cal/day diet, daily red meat, rare fish   Relevant past medical, surgical, family and social history reviewed and updated as indicated.  Allergies and medications reviewed and updated. Current Outpatient Prescriptions on File Prior to Visit  Medication Sig  . albuterol (PROVENTIL HFA;VENTOLIN HFA) 108 (90 BASE) MCG/ACT inhaler Inhale 2 puffs into the lungs every 6 (six) hours as needed for wheezing.   No current facility-administered medications on file prior to visit.    Review of Systems  Constitutional: Negative for fever, chills, activity change, appetite change, fatigue and unexpected weight change.  HENT: Negative for hearing loss.   Eyes: Negative for visual disturbance.  Respiratory: Positive for  cough (see HPI). Negative for chest tightness, shortness of breath and wheezing.   Cardiovascular: Positive for leg swelling. Negative for chest pain and palpitations.  Gastrointestinal: Negative for nausea, vomiting, abdominal pain, diarrhea, constipation, blood in stool and abdominal distention.  Genitourinary: Negative for hematuria and difficulty urinating.  Musculoskeletal: Negative for arthralgias, myalgias and neck pain.  Skin: Negative for rash.  Neurological: Negative for dizziness, seizures, syncope and headaches.  Hematological: Negative for adenopathy. Does not bruise/bleed easily.  Psychiatric/Behavioral: Negative for dysphoric mood. The patient is not nervous/anxious.    Per HPI unless specifically indicated above    Objective:    BP 134/90  Pulse 76  Temp(Src) 98.1 F (36.7 C) (Oral)  Ht 6\' 1"  (1.854 m)  Wt 386 lb 8 oz (175.315 kg)  BMI 51.00 kg/m2  Physical Exam  Nursing note and vitals reviewed. Constitutional: He is oriented to person, place, and time. He appears well-developed and well-nourished. No distress.  Morbidly obese  HENT:  Head: Normocephalic and atraumatic.  Right Ear: Hearing, tympanic membrane, external ear and ear canal normal.  Left Ear: Hearing, tympanic membrane, external ear and ear canal normal.  Nose: Nose normal.  Mouth/Throat: Uvula is midline, oropharynx is clear and moist and mucous membranes are normal. No oropharyngeal exudate, posterior oropharyngeal edema or posterior oropharyngeal erythema.  Eyes: Conjunctivae and EOM are normal. Pupils are equal, round, and reactive to light. No scleral icterus.  Neck: Normal range of  motion. Neck supple. No thyromegaly present.  Cardiovascular: Normal rate, regular rhythm, normal heart sounds and intact distal pulses.   No murmur heard. Pulses:      Radial pulses are 2+ on the right side, and 2+ on the left side.  Pulmonary/Chest: Effort normal and breath sounds normal. No respiratory distress. He  has no wheezes. He has no rales.  Abdominal: Soft. Bowel sounds are normal. He exhibits no distension and no mass. There is no tenderness. There is no rebound and no guarding.  Musculoskeletal: Normal range of motion. He exhibits no edema.  Lymphadenopathy:    He has no cervical adenopathy.  Neurological: He is alert and oriented to person, place, and time.  CN grossly intact, station and gait intact  Skin: Skin is warm and dry. Rash noted.  Erythematous pruritic slightly indurated rash mid crease between buttocks lower back.  Denies h/o boils.  Psychiatric: He has a normal mood and affect. His behavior is normal. Judgment and thought content normal.   Results for orders placed in visit on 11/22/13  LIPID PANEL      Result Value Ref Range   Cholesterol 204 (*) 0 - 200 mg/dL   Triglycerides 120.0  0.0 - 149.0 mg/dL   HDL 40.30  >39.00 mg/dL   VLDL 24.0  0.0 - 40.0 mg/dL   LDL Cholesterol 140 (*) 0 - 99 mg/dL   Total CHOL/HDL Ratio 5    COMPREHENSIVE METABOLIC PANEL      Result Value Ref Range   Sodium 140  135 - 145 mEq/L   Potassium 4.6  3.5 - 5.1 mEq/L   Chloride 104  96 - 112 mEq/L   CO2 30  19 - 32 mEq/L   Glucose, Bld 88  70 - 99 mg/dL   BUN 17  6 - 23 mg/dL   Creatinine, Ser 0.9  0.4 - 1.5 mg/dL   Total Bilirubin 1.0  0.2 - 1.2 mg/dL   Alkaline Phosphatase 77  39 - 117 U/L   AST 15  0 - 37 U/L   ALT 23  0 - 53 U/L   Total Protein 7.5  6.0 - 8.3 g/dL   Albumin 3.7  3.5 - 5.2 g/dL   Calcium 9.1  8.4 - 10.5 mg/dL   GFR 96.71  >60.00 mL/min  URIC ACID      Result Value Ref Range   Uric Acid, Serum 8.5 (*) 4.0 - 7.8 mg/dL      Assessment & Plan:   Problem List Items Addressed This Visit   Healthcare maintenance - Primary     Preventative protocols reviewed and updated unless pt declined. Discussed healthy diet and lifestyle.    Obesity     See above. Continue to ecourage weight loss attempts.    Relevant Orders      T4, Free      TSH   Gout     Reviewed with  patient need for ppx medication of recurrent gout flares.    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 We did not check blood work for this yet - will return tomorrow am for further evaluation Body mass index is 51 kg/(m^2).  Discussed importance of incorporating BOTH diet and healthy lifestyle changes for sustainable weight loss.    Relevant Orders      T4, Free      Cortisol-am, blood      TSH      Testosterone  FSH/LH   Skin rash     ?scarred skin from prior infection, vs intertrigo vs other.  Treat for 2 wks with lotrisone cream (sent to pharmacy.    Nasal congestion     Predominantly in am - suggested trial of zytec nightly and update me with effect.        Follow up plan: Return as needed.

## 2013-11-30 ENCOUNTER — Other Ambulatory Visit (INDEPENDENT_AMBULATORY_CARE_PROVIDER_SITE_OTHER): Payer: Commercial Managed Care - PPO

## 2013-11-30 DIAGNOSIS — R635 Abnormal weight gain: Secondary | ICD-10-CM

## 2013-11-30 DIAGNOSIS — E669 Obesity, unspecified: Secondary | ICD-10-CM

## 2013-11-30 LAB — TSH: TSH: 2.26 u[IU]/mL (ref 0.35–4.50)

## 2013-11-30 LAB — T4, FREE: FREE T4: 0.72 ng/dL (ref 0.60–1.60)

## 2013-11-30 NOTE — Addendum Note (Signed)
Addended by: Ria Bush on: 11/30/2013 07:20 AM   Modules accepted: Orders

## 2013-12-01 LAB — FSH/LH
FSH: 8 m[IU]/mL (ref 1.4–18.1)
LH: 4.5 m[IU]/mL (ref 1.5–9.3)

## 2013-12-01 LAB — CORTISOL-AM, BLOOD: Cortisol - AM: 10.5 ug/dL (ref 4.3–22.4)

## 2013-12-03 LAB — TESTOSTERONE, FREE, TOTAL, SHBG
Sex Hormone Binding: 38 nmol/L (ref 13–71)
Testosterone, Free: 43 pg/mL — ABNORMAL LOW (ref 47.0–244.0)
Testosterone-% Free: 1.8 % (ref 1.6–2.9)
Testosterone: 243 ng/dL — ABNORMAL LOW (ref 300–890)

## 2013-12-04 ENCOUNTER — Other Ambulatory Visit: Payer: Self-pay | Admitting: Family Medicine

## 2013-12-04 DIAGNOSIS — R7989 Other specified abnormal findings of blood chemistry: Secondary | ICD-10-CM

## 2013-12-05 ENCOUNTER — Other Ambulatory Visit (INDEPENDENT_AMBULATORY_CARE_PROVIDER_SITE_OTHER): Payer: Commercial Managed Care - PPO

## 2013-12-05 DIAGNOSIS — R7989 Other specified abnormal findings of blood chemistry: Secondary | ICD-10-CM

## 2013-12-05 DIAGNOSIS — E291 Testicular hypofunction: Secondary | ICD-10-CM

## 2013-12-06 LAB — TESTOSTERONE, FREE, TOTAL, SHBG
Sex Hormone Binding: 33 nmol/L (ref 13–71)
TESTOSTERONE FREE: 43.3 pg/mL — AB (ref 47.0–244.0)
TESTOSTERONE-% FREE: 1.9 % (ref 1.6–2.9)
Testosterone: 226 ng/dL — ABNORMAL LOW (ref 300–890)

## 2013-12-10 ENCOUNTER — Encounter: Payer: Self-pay | Admitting: Family Medicine

## 2013-12-10 DIAGNOSIS — E291 Testicular hypofunction: Secondary | ICD-10-CM | POA: Insufficient documentation

## 2013-12-12 ENCOUNTER — Other Ambulatory Visit: Payer: Self-pay | Admitting: Family Medicine

## 2013-12-12 MED ORDER — TESTOSTERONE 20.25 MG/ACT (1.62%) TD GEL: 1.0000 | Freq: Every day | TRANSDERMAL | Status: DC

## 2014-01-15 ENCOUNTER — Encounter: Payer: Self-pay | Admitting: Family Medicine

## 2014-01-15 ENCOUNTER — Ambulatory Visit (INDEPENDENT_AMBULATORY_CARE_PROVIDER_SITE_OTHER): Payer: Commercial Managed Care - PPO | Admitting: Family Medicine

## 2014-01-15 VITALS — BP 124/84 | HR 84 | Temp 97.9°F | Wt 387.5 lb

## 2014-01-15 DIAGNOSIS — E291 Testicular hypofunction: Secondary | ICD-10-CM

## 2014-01-15 DIAGNOSIS — J3489 Other specified disorders of nose and nasal sinuses: Secondary | ICD-10-CM

## 2014-01-15 DIAGNOSIS — R0981 Nasal congestion: Secondary | ICD-10-CM

## 2014-01-15 NOTE — Progress Notes (Signed)
Pre visit review using our clinic review tool, if applicable. No additional management support is needed unless otherwise documented below in the visit note. 

## 2014-01-15 NOTE — Assessment & Plan Note (Signed)
Positive response to testosterone supplementation after 1 mo supplementing. Reviewed need to monitor labwork while on testosterone (liver, blood counts, prostate, etc) Will return for 8am T check at his convenience, will obtain baseline PSA at that time

## 2014-01-15 NOTE — Patient Instructions (Addendum)
I'm glad fatigue is improving. We will see if we need to change dose. We will recheck blood work one morning at 8am - testosterone and prostate level. Try flonase daily in am for nasal congestion Return in 6 months for follow up visit.

## 2014-01-15 NOTE — Assessment & Plan Note (Signed)
Cough has improved some with nightly zyrtec. Will continue this. Encouraged add flonase daily to regimen - anticipate allergic rhinitis related. ?asthma hx.

## 2014-01-15 NOTE — Progress Notes (Signed)
BP 124/84  Pulse 84  Temp(Src) 97.9 F (36.6 C) (Oral)  Wt 387 lb 8 oz (175.769 kg)   CC: 1 mo f/u  Subjective:    Patient ID: Brandon Parker, male    DOB: [birth year], 43 y.o.   MRN: [id number]  HPI: Brandon Parker is a 43 y.o. male presenting on 01/15/2014 for Follow-up   Recent eval for abnl weight gain revealed low testosterone level with normal gonadotropins. Normal TSH, free T4, LH/FSH, and am cortisol level.  Started on androgel pump 1 act onto skin daily last month. Noticing significant improvement in energy level. Sleeping the night through at night, no daytime somnolence. Also endorsed weight loss initially, but now weight gain noted. Exercising on bike 15 min in am or walks 1 mile daily. Appetite about the same. Decreasing soda intake.  No chest pain, dyspnea, mood changes.  Continued cough - but improved with claritin every night   Wt Readings from Last 3 Encounters:  01/15/14 387 lb 8 oz (175.769 kg)  11/29/13 386 lb 8 oz (175.315 kg)  09/10/13 382 lb 12 oz (173.614 kg)   Body mass index is 51.14 kg/(m^2).  Relevant past medical, surgical, family and social history reviewed and updated as indicated.  Allergies and medications reviewed and updated. Current Outpatient Prescriptions on File Prior to Visit  Medication Sig  . albuterol (PROVENTIL HFA;VENTOLIN HFA) 108 (90 BASE) MCG/ACT inhaler Inhale 2 puffs into the lungs every 6 (six) hours as needed for wheezing.  . clotrimazole-betamethasone (LOTRISONE) cream Apply 1 application topically 2 (two) times daily. Max 2 weeks at a time  . Testosterone (ANDROGEL PUMP) 20.25 MG/ACT (1.62%) GEL Place 1 Act onto the skin daily. To shoulder and upper arms   No current facility-administered medications on file prior to visit.    Review of Systems Per HPI unless specifically indicated above    Objective:    BP 124/84  Pulse 84  Temp(Src) 97.9 F (36.6 C) (Oral)  Wt 387 lb 8 oz (175.769 kg)  Physical Exam  Nursing note  and vitals reviewed. Constitutional: He appears well-developed and well-nourished. No distress.  Morbidly obese  HENT:  Mouth/Throat: Oropharynx is clear and moist. No oropharyngeal exudate.  Cardiovascular: Normal rate, regular rhythm, normal heart sounds and intact distal pulses.   Pulmonary/Chest: Effort normal and breath sounds normal. No respiratory distress. He has no wheezes. He has no rales.   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 Reviewed need to monitor labwork while on testosterone (liver, blood counts, prostate, etc) Will return for 8am T check at his convenience, will obtain baseline PSA at that time    Relevant Orders      Testosterone      PSA   Nasal congestion     Cough has improved some with nightly zyrtec. Will continue this. Encouraged add flonase daily to regimen - anticipate allergic rhinitis related. ?asthma hx.        Follow up plan: Return in  about 6 months (around 07/17/2014), or as needed, for follow up visit.

## 2014-01-28 ENCOUNTER — Other Ambulatory Visit (INDEPENDENT_AMBULATORY_CARE_PROVIDER_SITE_OTHER): Payer: Commercial Managed Care - PPO

## 2014-01-28 DIAGNOSIS — E291 Testicular hypofunction: Secondary | ICD-10-CM

## 2014-01-29 LAB — TESTOSTERONE: TESTOSTERONE: 443.1 ng/dL (ref 300.00–890.00)

## 2014-01-29 LAB — PSA: PSA: 1.35 ng/mL (ref 0.10–4.00)

## 2014-01-30 ENCOUNTER — Encounter: Payer: Self-pay | Admitting: *Deleted

## 2014-02-13 ENCOUNTER — Other Ambulatory Visit: Payer: Self-pay | Admitting: Family Medicine

## 2014-02-13 NOTE — Telephone Encounter (Signed)
plz phone in. 

## 2014-02-14 NOTE — Telephone Encounter (Signed)
Rx called in as directed.   

## 2014-06-03 ENCOUNTER — Other Ambulatory Visit: Payer: Self-pay | Admitting: Family Medicine

## 2014-06-03 NOTE — Telephone Encounter (Signed)
Ok to refill 

## 2014-06-04 NOTE — Telephone Encounter (Signed)
plz phone in. Lab Results  Component Value Date   TESTOSTERONE 443.10 01/28/2014

## 2014-06-05 NOTE — Telephone Encounter (Signed)
Rx called in as directed.   

## 2014-07-08 ENCOUNTER — Ambulatory Visit (INDEPENDENT_AMBULATORY_CARE_PROVIDER_SITE_OTHER): Payer: Commercial Managed Care - PPO | Admitting: Internal Medicine

## 2014-07-08 ENCOUNTER — Encounter: Payer: Self-pay | Admitting: Internal Medicine

## 2014-07-08 VITALS — BP 130/88 | HR 83 | Temp 98.2°F | Wt 398.0 lb

## 2014-07-08 DIAGNOSIS — J189 Pneumonia, unspecified organism: Secondary | ICD-10-CM

## 2014-07-08 DIAGNOSIS — J209 Acute bronchitis, unspecified: Secondary | ICD-10-CM

## 2014-07-08 HISTORY — DX: Pneumonia, unspecified organism: J18.9

## 2014-07-08 MED ORDER — PREDNISONE 20 MG PO TABS: 40.0000 mg | ORAL_TABLET | Freq: Every day | ORAL | Status: DC

## 2014-07-08 MED ORDER — ALBUTEROL SULFATE HFA 108 (90 BASE) MCG/ACT IN AERS: 2.0000 | INHALATION_SPRAY | Freq: Four times a day (QID) | RESPIRATORY_TRACT | Status: DC | PRN

## 2014-07-08 MED ORDER — AZITHROMYCIN 250 MG PO TABS: ORAL_TABLET | ORAL | Status: DC

## 2014-07-08 NOTE — Progress Notes (Signed)
Subjective:    Patient ID: Brandon Parker, male    DOB: 21-Apr-1971, 43 y.o.   MRN: [id number]  HPI Here for evaluation of respiratory symptoms  Bad congestion for about 10 days Using mucinex with cough relief--helps temporarily Trouble sleeping due to bad congestion Dark brown green mucus --cough at night especially  Ongoing nasal congestion--despite the zyrtec Gets "fits" of coughing up lots of mucus Usually clear then--except slight blood tinges at times  Now very much worse though--very purulent No SOB other than in fits Now having right pleuritic pain now  No fever No night sweats or chills Some sore throat with this--affecting his voice. Tried honey and lemon No ear pain  Current Outpatient Prescriptions on File Prior to Visit  Medication Sig Dispense Refill  . ANDROGEL PUMP 20.25 MG/ACT (1.62%) GEL APPLY 1 PUMP ONCE DAILY ON SHOULDER OR UPPER ARM 75 g 3  . cetirizine (ZYRTEC) 10 MG tablet Take 10 mg by mouth daily.    Marland Kitchen albuterol (PROVENTIL HFA;VENTOLIN HFA) 108 (90 BASE) MCG/ACT inhaler Inhale 2 puffs into the lungs every 6 (six) hours as needed for wheezing. 1 Inhaler 2   No current facility-administered medications on file prior to visit.    No Known Allergies  Past Medical History  Diagnosis Date  . History of chicken pox   . Esophagitis 1992  . Fracture, humerus, open 2011  . Skull fracture 2011  . Morbid obesity   . Pulmonary nodules 2011    per CT scan [facility] - reviewed records and no mention of this  . Gout   . Secondary male hypogonadism 2015    nl SHBG, low free and tot T, nl LH/FSH    Past Surgical History  Procedure Laterality Date  . Orif humerus fracture  2011    hardware, incomplete healing, incomplete elbow motion  . Hiatal hernia repair  1990    esophagitis (nissen?)  . Vasectomy  1999    Family History  Problem Relation Age of Onset  . Diabetes Mother   . Clotting disorder Mother     ITP  . Cancer Father 69    thyroid  .  Cancer Paternal Aunt 46    lung, smoker  . Cancer Paternal Uncle 62    lung, smoker  . Coronary artery disease Maternal Grandmother 60  . Stroke Maternal Grandmother   . Cancer Maternal Grandfather 50    colon  . Asthma Daughter   . Asthma Son     History   Social History  . Marital Status: Single    Spouse Name: N/A    Number of Children: N/A  . Years of Education: N/A   Occupational History  . Not on file.   Social History Main Topics  . Smoking status: Never Smoker   . Smokeless tobacco: Never Used  . Alcohol Use: Yes     Comment: Occasional  . Drug Use: No  . Sexual Activity:    Partners: Female   Other Topics Concern  . Not on file   Social History Narrative   Caffeine: [employer] 2-3/day   Lives with mother, daughter, son   Stays with GF.   Occupation: Doctor, hospital   Activity: occasional exercise bike   Diet: good water, seldom fruits/vegetables, good protein, 1600 cal/day diet, daily red meat, rare fish   Review of Systems  No rash Loose stool 4 days ago No vomiting--can't due to past esophageal surgery Appetite is okay Energy levels are low with  this illness    Objective:   Physical Exam  Constitutional: He appears well-developed and well-nourished. No distress.  HENT:  No sinus tenderness TMs normal Mild nasal congestion Mild pharyngeal injection without exudates  Neck: Normal range of motion. Neck supple. No thyromegaly present.  Pulmonary/Chest: Effort normal. No respiratory distress. He has no rales.  Mild exp phase prolongation and exp wheezing  Lymphadenopathy:    He has no cervical adenopathy.          Assessment & Plan:

## 2014-07-08 NOTE — Progress Notes (Signed)
Pre visit review using our clinic review tool, if applicable. No additional management support is needed unless otherwise documented below in the visit note. 

## 2014-07-08 NOTE — Assessment & Plan Note (Signed)
May have some sinus involvement also Severity of cough may be part from bronchospasm Will treat with prednisone for 5 days z-pak--he has done well with this in the past

## 2014-09-17 ENCOUNTER — Encounter: Payer: Self-pay | Admitting: Family Medicine

## 2014-09-17 ENCOUNTER — Ambulatory Visit (INDEPENDENT_AMBULATORY_CARE_PROVIDER_SITE_OTHER): Payer: Commercial Managed Care - PPO | Admitting: Family Medicine

## 2014-09-17 VITALS — BP 124/80 | HR 82 | Temp 98.1°F | Ht 73.0 in | Wt >= 6400 oz

## 2014-09-17 DIAGNOSIS — J209 Acute bronchitis, unspecified: Secondary | ICD-10-CM

## 2014-09-17 DIAGNOSIS — H109 Unspecified conjunctivitis: Secondary | ICD-10-CM | POA: Insufficient documentation

## 2014-09-17 MED ORDER — PREDNISONE 20 MG PO TABS
40.0000 mg | ORAL_TABLET | Freq: Every day | ORAL | Status: DC
Start: 2014-09-17 — End: 2015-03-17

## 2014-09-17 MED ORDER — AZITHROMYCIN 250 MG PO TABS: ORAL_TABLET | ORAL | Status: DC

## 2014-09-17 NOTE — Progress Notes (Signed)
Pre visit review using our clinic review tool, if applicable. No additional management support is needed unless otherwise documented below in the visit note. 

## 2014-09-17 NOTE — Progress Notes (Signed)
   Subjective:    Patient ID: Brandon Parker, male    DOB: 07/15/1971, 44 y.o.   MRN: [id number]  Cough This is a new problem. The current episode started in the past 7 days. The problem has been gradually worsening. The cough is productive of sputum and productive of purulent sputum. Associated symptoms include ear congestion, nasal congestion and postnasal drip. Pertinent negatives include no chills, ear pain, fever, myalgias, sore throat or shortness of breath. Associated symptoms comments: Bilateral eyes red and draining. Irritated. Contact lens wearer but no wearing them now.  no sinus pressure.  fatigued  low back sore from  sleeping  so much.. The symptoms are aggravated by lying down. Risk factors: nonsmoker. Treatments tried: mucinex, albuterol prn. The treatment provided moderate relief. There is no history of asthma, COPD or environmental allergies. coughing fits caused by allergies.    Treated with Z pack and prednisone.in 06/2015. Helped dramatically.    Review of Systems  Constitutional: Negative for fever and chills.  HENT: Positive for postnasal drip. Negative for ear pain and sore throat.   Respiratory: Positive for cough. Negative for shortness of breath.   Musculoskeletal: Negative for myalgias.  Allergic/Immunologic: Negative for environmental allergies.       Objective:   Physical Exam  Constitutional: Vital signs are normal. He appears well-developed and well-nourished.  Non-toxic appearance. He does not appear ill. No distress.  HENT:  Head: Normocephalic and atraumatic.  Right Ear: Hearing, tympanic membrane, external ear and ear canal normal. No tenderness. No foreign bodies. Tympanic membrane is not retracted and not bulging.  Left Ear: Hearing, tympanic membrane, external ear and ear canal normal. No tenderness. No foreign bodies. Tympanic membrane is not retracted and not bulging.  Nose: Nose normal. No mucosal edema or rhinorrhea. Right sinus exhibits no  maxillary sinus tenderness and no frontal sinus tenderness. Left sinus exhibits no maxillary sinus tenderness and no frontal sinus tenderness.  Mouth/Throat: Uvula is midline, oropharynx is clear and moist and mucous membranes are normal. Normal dentition. No dental caries. No oropharyngeal exudate or tonsillar abscesses.  Eyes: EOM and lids are normal. Pupils are equal, round, and reactive to light. Lids are everted and swept, no foreign bodies found. Right conjunctiva is injected. Left conjunctiva is injected.  Yellow discharge bilateral eyes.  Neck: Trachea normal, normal range of motion and phonation normal. Neck supple. Carotid bruit is not present. No thyroid mass and no thyromegaly present.  Cardiovascular: Normal rate, regular rhythm, S1 normal, S2 normal, normal heart sounds, intact distal pulses and normal pulses.  Exam reveals no gallop.   No murmur heard. Pulmonary/Chest: Effort normal and breath sounds normal. No respiratory distress. He has no wheezes. He has no rhonchi. He has no rales.  Abdominal: Soft. Normal appearance and bowel sounds are normal. There is no hepatosplenomegaly. There is no tenderness. There is no rebound, no guarding and no CVA tenderness. No hernia.  Neurological: He is alert. He has normal reflexes.  Skin: Skin is warm, dry and intact. No rash noted.  Psychiatric: He has a normal mood and affect. His speech is normal and behavior is normal. Judgment normal.          Assessment & Plan:

## 2014-09-17 NOTE — Assessment & Plan Note (Signed)
Most likely viral but oral antibiotics will treat bacterial superinfection if present.  Treat with lubricating drops to soothe.

## 2014-09-17 NOTE — Patient Instructions (Signed)
Complete antibiotics. Prednisone taper and albuterol as needed for wheeze.  Call if not improving as expected. Go to ER if severe shortness of breath.

## 2014-09-17 NOTE — Assessment & Plan Note (Signed)
Treat with antibiotics given > 1 week of symptoms. Prednisone taper and albuterol given wheeze and reactive airways.

## 2015-02-05 ENCOUNTER — Encounter: Payer: Self-pay | Admitting: Family Medicine

## 2015-02-05 ENCOUNTER — Ambulatory Visit (INDEPENDENT_AMBULATORY_CARE_PROVIDER_SITE_OTHER): Payer: Commercial Managed Care - PPO | Admitting: Family Medicine

## 2015-02-05 VITALS — BP 120/82 | HR 74 | Temp 97.8°F | Wt >= 6400 oz

## 2015-02-05 DIAGNOSIS — L03312 Cellulitis of back [any part except buttock]: Secondary | ICD-10-CM | POA: Diagnosis not present

## 2015-02-05 DIAGNOSIS — L989 Disorder of the skin and subcutaneous tissue, unspecified: Secondary | ICD-10-CM | POA: Insufficient documentation

## 2015-02-05 MED ORDER — DOXYCYCLINE HYCLATE 100 MG PO TABS: 100.0000 mg | ORAL_TABLET | Freq: Two times a day (BID) | ORAL | Status: DC

## 2015-02-05 NOTE — Progress Notes (Signed)
   Subjective:    Patient ID: Brandon Parker, male    DOB: [birth year], 44 y.o.   MRN: [id number]  HPI  44 year old male pt  Of Dr. Corinna Capra following insect bite in middle on back 3-4 days ago. Unknown insect, noted  Irritation in central back over last 4 days, 2 days ago felt pain and swelling on back. Saw 2 puncture marks in center. Girlfriend pushed on lesion.Marland Kitchen Discharge copious white, yellow, blood. Treated with peroxide and neosporin.  In last 24 hours..redness size of silver dollar, firmness surrounding to about saucer size. Area is significantly tender.  No fever.  Social History /Family History/Past Medical History reviewed and updated if needed. No history of DM. No past boils.   No exposure of  MRSA, no healthcare   Review of Systems  Constitutional: Negative for fever and fatigue.  HENT: Negative for ear pain.   Eyes: Negative for pain.  Respiratory: Negative for shortness of breath.   Cardiovascular: Negative for chest pain.       Objective:   Physical Exam  Constitutional: Vital signs are normal. He appears well-developed and well-nourished.  Morbid obesity  HENT:  Head: Normocephalic.  Right Ear: Hearing normal.  Left Ear: Hearing normal.  Nose: Nose normal.  Mouth/Throat: Oropharynx is clear and moist and mucous membranes are normal.  Neck: Trachea normal. Carotid bruit is not present. No thyroid mass and no thyromegaly present.  Cardiovascular: Normal rate, regular rhythm and normal pulses.  Exam reveals no gallop, no distant heart sounds and no friction rub.   No murmur heard. No peripheral edema  Pulmonary/Chest: Effort normal and breath sounds normal. No respiratory distress.  Skin: Skin is warm, dry and intact. Rash noted.  4 cm x 2.5 cm erythema, no fluctuance,  13 cm diameter firmness  Psychiatric: He has a normal mood and affect. His speech is normal and behavior is normal. Thought content normal.          Assessment & Plan:

## 2015-02-05 NOTE — Patient Instructions (Addendum)
Complete antibiotic course.  Start warm compresses 3-4 times a day.  Call if not improving in next 48 hours before weekend or if redness spreading or fever.

## 2015-02-05 NOTE — Progress Notes (Signed)
Pre visit review using our clinic review tool, if applicable. No additional management support is needed unless otherwise documented below in the visit note. 

## 2015-02-05 NOTE — Assessment & Plan Note (Signed)
Already draining at home , no pus pocket to I and D. Treat with oral antibiotics and warm compresses.  Cover for MRSA.

## 2015-03-17 ENCOUNTER — Ambulatory Visit (INDEPENDENT_AMBULATORY_CARE_PROVIDER_SITE_OTHER): Payer: Commercial Managed Care - PPO | Admitting: Family Medicine

## 2015-03-17 ENCOUNTER — Encounter: Payer: Self-pay | Admitting: Family Medicine

## 2015-03-17 VITALS — BP 122/84 | HR 80 | Temp 98.1°F | Wt 398.5 lb

## 2015-03-17 DIAGNOSIS — E291 Testicular hypofunction: Secondary | ICD-10-CM

## 2015-03-17 DIAGNOSIS — L989 Disorder of the skin and subcutaneous tissue, unspecified: Secondary | ICD-10-CM

## 2015-03-17 MED ORDER — SULFAMETHOXAZOLE-TRIMETHOPRIM 800-160 MG PO TABS: 2.0000 | ORAL_TABLET | Freq: Two times a day (BID) | ORAL | Status: DC

## 2015-03-17 MED ORDER — CHLORHEXIDINE GLUCONATE 2 % EX SOLN: CUTANEOUS | Status: DC

## 2015-03-17 MED ORDER — MUPIROCIN 2 % EX OINT: 1.0000 "application " | TOPICAL_OINTMENT | Freq: Two times a day (BID) | CUTANEOUS | Status: DC

## 2015-03-17 NOTE — Assessment & Plan Note (Signed)
Folliculitis vs recurrent cellulitis/boils. Discussed this.  No current pustular lesions - but per pt history endorses significant drainage of pus from lesions.  Anticipate MRSA - treat with 10d bactrim 2tab DS BID course, treat possible colonization with nasal mupirocin ointment BID and discussed chlorhexadine body wash. Update if not improving with treatment.

## 2015-03-17 NOTE — Patient Instructions (Addendum)
For recurrent boils - treat with warm compresses. Don't pick at lesions.  Treat with bactrim DS 10d course.  Use nasal antibotic sent to pharmacy.  Use chlorhexidine wash sent to pharmacy (use once weekly).  Let us know if persistent trouble.  Return for 8am labs to recheck testosterone levels.

## 2015-03-17 NOTE — Progress Notes (Signed)
Pre visit review using our clinic review tool, if applicable. No additional management support is needed unless otherwise documented below in the visit note. 

## 2015-03-17 NOTE — Progress Notes (Signed)
BP 122/84 mmHg  Pulse 80  Temp(Src) 98.1 F (36.7 C) (Oral)  Wt 398 lb 8 oz (180.758 kg)   CC: discuss recurrent skin infections/boils Subjective:    Patient ID: Brandon Parker, male    DOB: [birth year], 44 y.o.   MRN: [id number]  HPI: Brandon Parker is a 44 y.o. male presenting on 03/17/2015 for Recurrent Skin Infections   Recurrent skin infections - last seen 01/2015 with lesion on back - treated with 10d doxy course. This resolved over 1 week. He did use warm compresses.   1 wk ago with L chin boil present-  he has drained this.  Has also had several boils develop on skin over last few weeks.   He has been working on cars in garage in 90 degree heat.   Has used OTC Prid drawing salve from [place].   No know exposure to MRSA.  No recurrent h/o skin infections.   Not currently taking androgel pump.  Oh by the way...requests restarting testosterone pump - as he did notice significant improvement in energy levels while on testosterone.  Relevant past medical, surgical, family and social history reviewed and updated as indicated. Interim medical history since our last visit reviewed. Allergies and medications reviewed and updated. Current Outpatient Prescriptions on File Prior to Visit  Medication Sig  . albuterol (PROVENTIL HFA;VENTOLIN HFA) 108 (90 BASE) MCG/ACT inhaler Inhale 2 puffs into the lungs every 6 (six) hours as needed for wheezing.  . ANDROGEL PUMP 20.25 MG/ACT (1.62%) GEL APPLY 1 PUMP ONCE DAILY ON SHOULDER OR UPPER ARM (Patient not taking: Reported on 02/05/2015)  . cetirizine (ZYRTEC) 10 MG tablet Take 10 mg by mouth daily.   No current facility-administered medications on file prior to visit.    Review of Systems Per HPI unless specifically indicated above     Objective:    BP 122/84 mmHg  Pulse 80  Temp(Src) 98.1 F (36.7 C) (Oral)  Wt 398 lb 8 oz (180.758 kg)  Wt Readings from Last 3 Encounters:  03/17/15 398 lb 8 oz (180.758 kg)  02/05/15 405 lb  (183.707 kg)  09/17/14 400 lb 8 oz (181.666 kg)    Physical Exam  Constitutional: He appears well-developed and well-nourished. No distress.  Morbidly obese  Skin: Skin is warm and dry. No rash noted. No erythema.  Multiple lesions throughout body - some lower back, erythematous indurated lesion without fluctuance R upper back, scab L cheek, 2 scabs on scalp.  Nursing note and vitals reviewed.  Results for orders placed or performed in visit on 01/28/14  Testosterone  Result Value Ref Range   Testosterone 443.10 300.00 - 890.00 ng/dL  PSA  Result Value Ref Range   PSA 1.35 0.10 - 4.00 ng/mL      Assessment & Plan:   Problem List Items Addressed This Visit    Secondary male hypogonadism    Pt requests restarting testosterone supplement - will check 8am Testosterone and then consider restarting. Off androgel for the past year.      Relevant Orders   Testosterone, Free, Total, SHBG   Skin lesions - Primary    Folliculitis vs recurrent cellulitis/boils. Discussed this.  No current pustular lesions - but per pt history endorses significant drainage of pus from lesions.  Anticipate MRSA - treat with 10d bactrim 2tab DS BID course, treat possible colonization with nasal mupirocin ointment BID and discussed chlorhexadine body wash. Update if not improving with treatment.  Follow up plan: Return if symptoms worsen or fail to improve.

## 2015-03-17 NOTE — Assessment & Plan Note (Signed)
Pt requests restarting testosterone supplement - will check 8am Testosterone and then consider restarting. Off androgel for the past year.

## 2015-03-18 ENCOUNTER — Encounter: Payer: Self-pay | Admitting: *Deleted

## 2015-03-18 ENCOUNTER — Other Ambulatory Visit (INDEPENDENT_AMBULATORY_CARE_PROVIDER_SITE_OTHER): Payer: Commercial Managed Care - PPO

## 2015-03-18 DIAGNOSIS — E291 Testicular hypofunction: Secondary | ICD-10-CM | POA: Diagnosis not present

## 2015-03-19 LAB — TESTOSTERONE, FREE, TOTAL, SHBG
SEX HORMONE BINDING: 37 nmol/L (ref 10–50)
TESTOSTERONE FREE: 29.8 pg/mL — AB (ref 47.0–244.0)
TESTOSTERONE: 170 ng/dL — AB (ref 300–890)
Testosterone-% Free: 1.8 % (ref 1.6–2.9)

## 2015-03-20 ENCOUNTER — Other Ambulatory Visit: Payer: Self-pay | Admitting: Family Medicine

## 2015-03-20 DIAGNOSIS — E291 Testicular hypofunction: Secondary | ICD-10-CM

## 2015-03-20 MED ORDER — TESTOSTERONE 20.25 MG/ACT (1.62%) TD GEL: TRANSDERMAL | Status: DC

## 2015-04-24 ENCOUNTER — Other Ambulatory Visit (INDEPENDENT_AMBULATORY_CARE_PROVIDER_SITE_OTHER): Payer: Commercial Managed Care - PPO

## 2015-04-24 DIAGNOSIS — E291 Testicular hypofunction: Secondary | ICD-10-CM | POA: Diagnosis not present

## 2015-04-24 LAB — PSA: PSA: 0.9 ng/mL (ref 0.10–4.00)

## 2015-04-24 LAB — COMPREHENSIVE METABOLIC PANEL
ALBUMIN: 3.7 g/dL (ref 3.5–5.2)
ALT: 20 U/L (ref 0–53)
AST: 13 U/L (ref 0–37)
Alkaline Phosphatase: 93 U/L (ref 39–117)
BUN: 19 mg/dL (ref 6–23)
CALCIUM: 9.3 mg/dL (ref 8.4–10.5)
CHLORIDE: 102 meq/L (ref 96–112)
CO2: 32 meq/L (ref 19–32)
CREATININE: 0.94 mg/dL (ref 0.40–1.50)
GFR: 92.54 mL/min (ref >60.00)
Glucose, Bld: 105 mg/dL — ABNORMAL HIGH (ref 70–99)
Potassium: 4.9 mEq/L (ref 3.5–5.1)
Sodium: 141 mEq/L (ref 135–145)
Total Bilirubin: 0.4 mg/dL (ref 0.2–1.2)
Total Protein: 7.3 g/dL (ref 6.0–8.3)

## 2015-04-24 LAB — LIPID PANEL
CHOLESTEROL: 217 mg/dL — AB (ref 0–200)
HDL: 47.3 mg/dL (ref >39.00)
LDL CALC: 153 mg/dL — AB (ref 0–99)
NonHDL: 169.74
TRIGLYCERIDES: 82 mg/dL (ref 0.0–149.0)
Total CHOL/HDL Ratio: 5
VLDL: 16.4 mg/dL (ref 0.0–40.0)

## 2015-04-24 LAB — CBC WITH DIFFERENTIAL/PLATELET
BASOS ABS: 0.1 10*3/uL (ref 0.0–0.1)
Basophils Relative: 0.4 % (ref 0.0–3.0)
Eosinophils Absolute: 0.2 10*3/uL (ref 0.0–0.7)
Eosinophils Relative: 1.9 % (ref 0.0–5.0)
HEMATOCRIT: 45.2 % (ref 39.0–52.0)
Hemoglobin: 15 g/dL (ref 13.0–17.0)
LYMPHS PCT: 20.2 % (ref 12.0–46.0)
Lymphs Abs: 2.3 10*3/uL (ref 0.7–4.0)
MCHC: 33.1 g/dL (ref 30.0–36.0)
MCV: 90 fl (ref 78.0–100.0)
MONOS PCT: 5.2 % (ref 3.0–12.0)
Monocytes Absolute: 0.6 10*3/uL (ref 0.1–1.0)
NEUTROS ABS: 8.3 10*3/uL — AB (ref 1.4–7.7)
Neutrophils Relative %: 72.3 % (ref 43.0–77.0)
PLATELETS: 264 10*3/uL (ref 150.0–400.0)
RBC: 5.02 Mil/uL (ref 4.22–5.81)
RDW: 13.8 % (ref 11.5–15.5)
WBC: 11.4 10*3/uL — ABNORMAL HIGH (ref 4.0–10.5)

## 2015-04-24 LAB — TESTOSTERONE: Testosterone: 200.04 ng/dL — ABNORMAL LOW (ref 300.00–890.00)

## 2015-04-24 NOTE — Addendum Note (Signed)
Addended by: Marchia Bond on: 04/24/2015 07:54 AM   Modules accepted: Orders

## 2015-04-26 ENCOUNTER — Other Ambulatory Visit: Payer: Self-pay | Admitting: Family Medicine

## 2015-04-26 MED ORDER — TESTOSTERONE 20.25 MG/ACT (1.62%) TD GEL: 2.0000 | Freq: Every day | TRANSDERMAL | Status: DC

## 2015-06-25 ENCOUNTER — Other Ambulatory Visit: Payer: Self-pay | Admitting: Family Medicine

## 2015-06-25 DIAGNOSIS — R7989 Other specified abnormal findings of blood chemistry: Secondary | ICD-10-CM

## 2015-07-01 ENCOUNTER — Other Ambulatory Visit: Payer: Commercial Managed Care - PPO

## 2015-07-03 ENCOUNTER — Other Ambulatory Visit (INDEPENDENT_AMBULATORY_CARE_PROVIDER_SITE_OTHER): Payer: Commercial Managed Care - PPO

## 2015-07-03 DIAGNOSIS — E291 Testicular hypofunction: Secondary | ICD-10-CM | POA: Diagnosis not present

## 2015-07-03 DIAGNOSIS — R7989 Other specified abnormal findings of blood chemistry: Secondary | ICD-10-CM

## 2015-07-03 LAB — TESTOSTERONE: Testosterone: 429.63 ng/dL (ref 300.00–890.00)

## 2015-07-06 ENCOUNTER — Other Ambulatory Visit: Payer: Self-pay | Admitting: Family Medicine

## 2015-07-06 MED ORDER — TESTOSTERONE 20.25 MG/ACT (1.62%) TD GEL: 2.0000 | Freq: Every day | TRANSDERMAL | Status: DC

## 2015-07-08 ENCOUNTER — Ambulatory Visit (INDEPENDENT_AMBULATORY_CARE_PROVIDER_SITE_OTHER): Payer: Commercial Managed Care - PPO | Admitting: Family Medicine

## 2015-07-08 ENCOUNTER — Encounter: Payer: Self-pay | Admitting: Family Medicine

## 2015-07-08 VITALS — BP 140/80 | HR 73 | Temp 98.1°F | Wt >= 6400 oz

## 2015-07-08 DIAGNOSIS — R69 Illness, unspecified: Principal | ICD-10-CM

## 2015-07-08 DIAGNOSIS — J111 Influenza due to unidentified influenza virus with other respiratory manifestations: Secondary | ICD-10-CM

## 2015-07-08 LAB — POCT INFLUENZA A/B
INFLUENZA A, POC: NEGATIVE
INFLUENZA B, POC: NEGATIVE

## 2015-07-08 NOTE — Addendum Note (Signed)
Addended byCloyd Stagers B on: 07/08/2015 05:15 PM   Modules accepted: Orders

## 2015-07-08 NOTE — Patient Instructions (Signed)
Flu swab negative today. I think you have a flu-like illness.  Treat with ibuprofen 400-600mg  twice daily with food for inflammation, albuterol, fluids and rest.  Out of work until fever free for 24 hours.  Bland diet over next 24-48 hrs with plenty of water. Let us know if not improving as expected.

## 2015-07-08 NOTE — Progress Notes (Signed)
BP 140/80 mmHg  Pulse 73  Temp(Src) 98.1 F (36.7 C) (Oral)  Wt 411 lb (186.428 kg)  SpO2 96%   CC: headache, congestion  Subjective:    Patient ID: Brandon Parker, male    DOB: 06-Sep-1970, 44 y.o.   MRN: [id number]  HPI: Brandon Parker is a 44 y.o. male presenting on 07/08/2015 for Headache and Nasal Congestion   5d h/o cough, congestion. Treated with mucinex and zyrtec. Yesterday started feeling better, but then this morning when he woke up felt clammy, feverish with chills, bad headache with lightheadedness and nausea. Some abd discomfort. 2 days ago with mild diarrhea.   Daughter tested positive for influenza this morning. Pt did not receive flu shot.  No smokers at home. No h/o asthma.  Relevant past medical, surgical, family and social history reviewed and updated as indicated. Interim medical history since our last visit reviewed. Allergies and medications reviewed and updated. Current Outpatient Prescriptions on File Prior to Visit  Medication Sig  . albuterol (PROVENTIL HFA;VENTOLIN HFA) 108 (90 BASE) MCG/ACT inhaler Inhale 2 puffs into the lungs every 6 (six) hours as needed for wheezing.  . cetirizine (ZYRTEC) 10 MG tablet Take 10 mg by mouth daily.  . Testosterone (ANDROGEL PUMP) 20.25 MG/ACT (1.62%) GEL Place 2 Act onto the skin daily. APPLY 2 PUMPS ONCE DAILY ON SHOULDER OR UPPER ARM   No current facility-administered medications on file prior to visit.    Review of Systems Per HPI unless specifically indicated in ROS section     Objective:    BP 140/80 mmHg  Pulse 73  Temp(Src) 98.1 F (36.7 C) (Oral)  Wt 411 lb (186.428 kg)  SpO2 96%  Wt Readings from Last 3 Encounters:  07/08/15 411 lb (186.428 kg)  03/17/15 398 lb 8 oz (180.758 kg)  02/05/15 405 lb (183.707 kg)    Physical Exam  Constitutional: He appears well-developed and well-nourished. No distress.  HENT:  Head: Normocephalic and atraumatic.  Right Ear: Hearing, tympanic membrane, external  ear and ear canal normal.  Left Ear: Hearing, external ear and ear canal normal.  Nose: Mucosal edema present. No rhinorrhea. Right sinus exhibits no maxillary sinus tenderness and no frontal sinus tenderness. Left sinus exhibits no maxillary sinus tenderness and no frontal sinus tenderness.  Mouth/Throat: Uvula is midline and mucous membranes are normal. Posterior oropharyngeal edema and posterior oropharyngeal erythema present. No oropharyngeal exudate or tonsillar abscesses.  Mild erythema of L TM Nasal mucosal congestion  Eyes: Conjunctivae and EOM are normal. Pupils are equal, round, and reactive to light. No scleral icterus.  Neck: Normal range of motion. Neck supple.  Cardiovascular: Normal rate, regular rhythm, normal heart sounds and intact distal pulses.   No murmur heard. Pulmonary/Chest: Effort normal. No respiratory distress. He has wheezes (mild exp). He has rhonchi (bibasilarly). He has no rales.  Lymphadenopathy:    He has no cervical adenopathy.  Skin: Skin is warm and dry. No rash noted.  Nursing note and vitals reviewed.  Results for orders placed or performed in visit on 07/03/15  Testosterone  Result Value Ref Range   Testosterone 429.63 300.00 - 890.00 ng/dL      Assessment & Plan:   Problem List Items Addressed This Visit    Influenza-like illness - Primary    Flu swab negative. Anticipate influenza like illness. Supportive care discussed. rec ibuprofen, albuterol, fluid and rest.  Out of work until fever free for 24 hours.  Bland diet over next 24-48 hrs. Pt  declined cough syrup and anti-nausea med.          Follow up plan: Return if symptoms worsen or fail to improve.

## 2015-07-08 NOTE — Assessment & Plan Note (Signed)
Flu swab negative. Anticipate influenza like illness. Supportive care discussed. rec ibuprofen, albuterol, fluid and rest.  Out of work until fever free for 24 hours.  Bland diet over next 24-48 hrs. Pt declined cough syrup and anti-nausea med.

## 2015-07-09 ENCOUNTER — Encounter: Payer: Self-pay | Admitting: *Deleted

## 2015-07-23 ENCOUNTER — Ambulatory Visit (INDEPENDENT_AMBULATORY_CARE_PROVIDER_SITE_OTHER): Payer: Commercial Managed Care - PPO | Admitting: Family Medicine

## 2015-07-23 ENCOUNTER — Encounter: Payer: Self-pay | Admitting: Family Medicine

## 2015-07-23 ENCOUNTER — Ambulatory Visit: Payer: Commercial Managed Care - PPO | Admitting: Family Medicine

## 2015-07-23 VITALS — BP 156/92 | HR 84 | Temp 98.2°F | Resp 24 | Wt >= 6400 oz

## 2015-07-23 DIAGNOSIS — J209 Acute bronchitis, unspecified: Secondary | ICD-10-CM | POA: Diagnosis not present

## 2015-07-23 DIAGNOSIS — R062 Wheezing: Secondary | ICD-10-CM | POA: Diagnosis not present

## 2015-07-23 DIAGNOSIS — J4540 Moderate persistent asthma, uncomplicated: Secondary | ICD-10-CM | POA: Insufficient documentation

## 2015-07-23 DIAGNOSIS — R0602 Shortness of breath: Secondary | ICD-10-CM

## 2015-07-23 MED ORDER — IPRATROPIUM-ALBUTEROL 0.5-2.5 (3) MG/3ML IN SOLN
3.0000 mL | Freq: Once | RESPIRATORY_TRACT | Status: AC
  Administered 2015-07-23: 3 mL via RESPIRATORY_TRACT

## 2015-07-23 MED ORDER — PREDNISONE 50 MG PO TABS: 50.0000 mg | ORAL_TABLET | Freq: Every day | ORAL | Status: DC

## 2015-07-23 MED ORDER — AZITHROMYCIN 250 MG PO TABS: ORAL_TABLET | ORAL | Status: DC

## 2015-07-23 MED ORDER — IPRATROPIUM BROMIDE 0.02 % IN SOLN: 0.5000 mg | Freq: Four times a day (QID) | RESPIRATORY_TRACT | Status: DC

## 2015-07-23 MED ORDER — HYDROCODONE-HOMATROPINE 5-1.5 MG/5ML PO SYRP: 5.0000 mL | ORAL_SOLUTION | Freq: Three times a day (TID) | ORAL | Status: DC | PRN

## 2015-07-23 NOTE — Patient Instructions (Signed)
- CXR ordered pt will be called with results once available.  - Z-pack/prednisone called in  - albuterol every 4-6 hours. 2 puffs. Then as needed.  - rest, hydrate, zyrtec,  - hycodan cough syrup as needed only, caution with  - atrovent nasal spray - mucinex plain, start    Acute Bronchitis Bronchitis is inflammation of the airways that extend from the windpipe into the lungs (bronchi). The inflammation often causes mucus to develop. This leads to a cough, which is the most common symptom of bronchitis.  In acute bronchitis, the condition usually develops suddenly and goes away over time, usually in a couple weeks. Smoking, allergies, and asthma can make bronchitis worse. Repeated episodes of bronchitis may cause further lung problems.  CAUSES Acute bronchitis is most often caused by the same virus that causes a cold. The virus can spread from person to person (contagious) through coughing, sneezing, and touching contaminated objects. SIGNS AND SYMPTOMS   Cough.   Fever.   Coughing up mucus.   Body aches.   Chest congestion.   Chills.   Shortness of breath.   Sore throat.  DIAGNOSIS  Acute bronchitis is usually diagnosed through a physical exam. Your health care provider will also ask you questions about your medical history. Tests, such as chest X-rays, are sometimes done to rule out other conditions.  TREATMENT  Acute bronchitis usually goes away in a couple weeks. Oftentimes, no medical treatment is necessary. Medicines are sometimes given for relief of fever or cough. Antibiotic medicines are usually not needed but may be prescribed in certain situations. In some cases, an inhaler may be recommended to help reduce shortness of breath and control the cough. A cool mist vaporizer may also be used to help thin bronchial secretions and make it easier to clear the chest.  HOME CARE INSTRUCTIONS  Get plenty of rest.   Drink enough fluids to keep your urine clear or  pale yellow (unless you have a medical condition that requires fluid restriction). Increasing fluids may help thin your respiratory secretions (sputum) and reduce chest congestion, and it will prevent dehydration.   Take medicines only as directed by your health care provider.  If you were prescribed an antibiotic medicine, finish it all even if you start to feel better.  Avoid smoking and secondhand smoke. Exposure to cigarette smoke or irritating chemicals will make bronchitis worse. If you are a smoker, consider using nicotine gum or skin patches to help control withdrawal symptoms. Quitting smoking will help your lungs heal faster.   Reduce the chances of another bout of acute bronchitis by washing your hands frequently, avoiding people with cold symptoms, and trying not to touch your hands to your mouth, nose, or eyes.   Keep all follow-up visits as directed by your health care provider.  SEEK MEDICAL CARE IF: Your symptoms do not improve after 1 week of treatment.  SEEK IMMEDIATE MEDICAL CARE IF:  You develop an increased fever or chills.   You have chest pain.   You have severe shortness of breath.  You have bloody sputum.   You develop dehydration.  You faint or repeatedly feel like you are going to pass out.  You develop repeated vomiting.  You develop a severe headache. MAKE SURE YOU:   Understand these instructions.  Will watch your condition.  Will get help right away if you are not doing well or get worse.   This information is not intended to replace advice given to you by  your health care provider. Make sure you discuss any questions you have with your health care provider.   Document Released: 08/12/2004 Document Revised: 07/26/2014 Document Reviewed: 12/26/2012 Elsevier Interactive Patient Education [employer].

## 2015-07-23 NOTE — Progress Notes (Signed)
Patient ID: Brandon Parker, male   DOB: August 28, 1970, 45 y.o.   MRN: [id number]   Subjective:    Patient ID: Brandon Parker    DOB: [birth year], 45 y.o.    MRN: [id number]  HPI  Cough: Patient presents with a 2 day history of worsening fatigue, productive cough, fever, shortness of breath, postnasal drainage. Patient states he is eating and drinking well. He states he had an acute illness [place] 2 weeks ago that was consistent with the flu, but he tested negative. He reports he did get better initially, and then worsened over the last 2 days. He reports he came home from work yesterday and just fell asleep, which is unlike him. He reports his cough is worse when he is laying down. He states his cough will get to the point where he feels like he is choking, and last greater than 15 minutes. He has an albuterol inhaler, that he states only uses when he is ill. He has used intermittently over the course of this illness, but not within the last few days. Patient states he doesn't have a history of asthma, although there seems to be some debate about that by prior providers. He he has had frequent bouts of acute bronchitis for the last few years.  OTC: mucinex,  Airborne/zinc tab. Albuterol when necessary. Zyrtec  Past Medical History  Diagnosis Date  . History of chicken pox   . Esophagitis 1992  . Fracture, humerus, open 2011  . Skull fracture ([place]) 2011  . Morbid obesity ([place])   . Pulmonary nodules 2011    per CT scan [facility] - reviewed records and no mention of this  . Gout   . Secondary male hypogonadism 2015    nl SHBG, low free and tot T, nl LH/FSH   No Known Allergies Past Surgical History  Procedure Laterality Date  . Orif humerus fracture  2011    hardware, incomplete healing, incomplete elbow motion  . Hiatal hernia repair  1990    esophagitis (nissen?)  . Vasectomy  1999   Social History  Substance Use Topics  . Smoking status: Never Smoker   . Smokeless tobacco: Never Used  .  Alcohol Use: 0.0 oz/week    0 Standard drinks or equivalent per week     Comment: Occasional    Review of Systems Negative, with the exception of above mentioned in HPI     Objective:   Physical Exam BP 156/92 mmHg  Pulse 84  Temp(Src) 98.2 F (36.8 C)  Resp 24  Wt 406 lb 4 oz (184.274 kg)  SpO2 94% Body mass index is 53.61 kg/(m^2). Gen: Afebrile. No acute distress. Nontoxic in appearance. Well developed, well nourished, obese Caucasian male. Pleasant, talkative. HENT: AT. [place]. Bilateral TM visualized, no erythema or bulging. MMM, no oral lesions. Bilateral nares with erythema, no swelling. Throat without erythema, no exudates. Cough present, Hoarseness present, TTP not present on facial sinuses Eyes:Pupils Equal Round Reactive to light, Extraocular movements intact,  Conjunctiva without redness, discharge or icterus. Neck/lymp/endocrine: Supple, no lymphadenopathy CV: Regular rate and rhythm Chest: Diffuse wheezing in all lung fields, no rhonchi or crackles appreciated. Good air movement. Normal respiratory effort. Abd: Soft. NTND. BS present Skin: No rashes, purpura or petechiae.    Assessment & Plan:  Brandon Parker is a 45 y.o. present for acute OV with likely bronchitis/mild asthma exacerbation. Patient had recent flulike illness approximately 2 weeks ago. 1. Shortness of breath/wheezing/cough/acute bronchitis - DuoNeb treatment provided in office, helped  with patient's shortness of breath symptoms, decreased wheezing mildly but did not resolve. - CXR ordered, rule out pneumonia. pt will be called with results once available.  - azith/prednisone prescribed - albuterol every 4-6 hours. 2 puffs, next 48 hours, Then as needed only.  - rest hydrate, zyrtec - hycodan.  - atrovent nasal spray - mucinex - ipratropium-albuterol (DUONEB) 0.5-2.5 (3) MG/3ML nebulizer solution 3 mL; Take 3 mLs by nebulization once. Follow-up one week with PCP

## 2015-07-24 ENCOUNTER — Telehealth: Payer: Self-pay | Admitting: Family Medicine

## 2015-07-24 ENCOUNTER — Ambulatory Visit (INDEPENDENT_AMBULATORY_CARE_PROVIDER_SITE_OTHER)
Admission: RE | Admit: 2015-07-24 | Discharge: 2015-07-24 | Disposition: A | Discharge status: Home / Self Care | Payer: Commercial Managed Care - PPO | Source: Ambulatory Visit | Attending: Family Medicine | Admitting: Family Medicine

## 2015-07-24 DIAGNOSIS — J209 Acute bronchitis, unspecified: Secondary | ICD-10-CM | POA: Diagnosis not present

## 2015-07-24 MED ORDER — IPRATROPIUM BROMIDE 0.03 % NA SOLN: 2.0000 | Freq: Two times a day (BID) | NASAL | Status: DC

## 2015-07-24 NOTE — Telephone Encounter (Signed)
Patient called back. Patient is going to contact [facility] to see what time he can go for xray today. Will go to pharmacy thanks

## 2015-07-24 NOTE — Telephone Encounter (Signed)
Please call pt: - I am not certain why the cxr did not go through, it is placed now, and for [place] location.  - I have also corrected the nasal spray... My apologies.

## 2015-07-24 NOTE — Addendum Note (Signed)
Addended by: Howard Pouch A on: 07/24/2015 08:55 AM   Modules accepted: Orders, Medications

## 2015-07-24 NOTE — Telephone Encounter (Signed)
Patient went to pharmacy to pick up nasal spray & they had a nebulizer treatment instead, he has an old machine that his daughter used to use however he was expecting a nasal spray. Patient also went to [place] for a chest xray last night. Order was not in system. Patient is working in [place] today so he can go to [facility] to have the chest xray. Please call him. His extension is [phone].

## 2015-07-25 ENCOUNTER — Telehealth: Payer: Self-pay | Admitting: Family Medicine

## 2015-07-25 NOTE — Telephone Encounter (Signed)
Spoke with patient reviewed xray results and instructions with patient. Patient will follow up with PCP as instructed.

## 2015-07-25 NOTE — Telephone Encounter (Signed)
Please call pt: - he does have a probable LLL pneumonia by his cxr. - Please have him follow up with his PCP in 1-2 weeks, sooner if worsening or no improvement.  Dg Chest 2 View  07/24/2015  CLINICAL DATA:  Wheezing.  Acute bronchitis. EXAM: CHEST  2 VIEW COMPARISON:  09/10/2013. FINDINGS: Mediastinum and hilar structures normal. Mild left base subsegmental atelectasis and/or infiltrate. No pleural effusion or pneumothorax. No acute bony abnormality. IMPRESSION: Mild left base subsegmental atelectasis and or infiltrate . Electronically Signed   By: Marcello Moores  Register   On: 07/24/2015 16:02

## 2015-07-30 ENCOUNTER — Encounter: Payer: Self-pay | Admitting: Family Medicine

## 2015-07-30 ENCOUNTER — Ambulatory Visit (INDEPENDENT_AMBULATORY_CARE_PROVIDER_SITE_OTHER): Payer: Commercial Managed Care - PPO | Admitting: Family Medicine

## 2015-07-30 VITALS — BP 114/80 | HR 70 | Temp 98.0°F | Wt >= 6400 oz

## 2015-07-30 DIAGNOSIS — J189 Pneumonia, unspecified organism: Secondary | ICD-10-CM | POA: Diagnosis not present

## 2015-07-30 DIAGNOSIS — J45901 Unspecified asthma with (acute) exacerbation: Secondary | ICD-10-CM

## 2015-07-30 MED ORDER — LEVOFLOXACIN 500 MG PO TABS: 500.0000 mg | ORAL_TABLET | Freq: Every day | ORAL | Status: DC

## 2015-07-30 MED ORDER — PREDNISONE 20 MG PO TABS: ORAL_TABLET | ORAL | Status: DC

## 2015-07-30 NOTE — Patient Instructions (Addendum)
I think you have persistent pneumonia - expand antibiotic to levaquin sent to pharmacy. Continue albuterol, mucinex, hycodan for night at night time. Continue to push fluids and rest.  If persistent shorthness of breath despite antibiotic, fill prednisone taper.  If not improving as expected with treatment, please return to see [place].

## 2015-07-30 NOTE — Progress Notes (Signed)
Pre visit review using our clinic review tool, if applicable. No additional management support is needed unless otherwise documented below in the visit note. 

## 2015-07-30 NOTE — Progress Notes (Signed)
BP 114/80 mmHg  Pulse 70  Temp(Src) 98 F (36.7 C) (Oral)  Wt 406 lb (184.16 kg)  SpO2 97%   CC: f/u PNA  Subjective:    Patient ID: Brandon Parker, male    DOB: August 25, 1970, 45 y.o.   MRN: [id number]  HPI: Brandon Parker is a 45 y.o. male presenting on 07/30/2015 for Follow-up   Seen here 07/08/2015 with dx flu like illness, flu swab negative - treated with supportive care. This got better. Episode of coughing fits with dyspnea and feverish. Seen by Dr Raoul Pitch at [facility] and diagnosed with acute bronchitis with asthma exacerbation - treated with duoneb in office then zpakc/prednisone and albuterol as well as hycodan cough syrup. CXR with possible LLL PNA.   Feels he is getting worse. Cough worse, more productive, more PNDrainage with sinus congestion. Feels fatigued. Worsening dyspnea with exertion. Slight headache. Ongoing chronic wheezing.  No fevers/chills.  Taking plain mucinex. Hycodan cough syrup working well at night time. Continues albuterol 2 puffs Q6 hours.   Denies recent prolonged immobility or leg swelling. No personal or family h/o DVT/PE  Relevant past medical, surgical, family and social history reviewed and updated as indicated. Interim medical history since our last visit reviewed. Allergies and medications reviewed and updated. Current Outpatient Prescriptions on File Prior to Visit  Medication Sig  . albuterol (PROVENTIL HFA;VENTOLIN HFA) 108 (90 BASE) MCG/ACT inhaler Inhale 2 puffs into the lungs every 6 (six) hours as needed for wheezing.  . cetirizine (ZYRTEC) 10 MG tablet Take 10 mg by mouth daily.  Marland Kitchen HYDROcodone-homatropine (HYCODAN) 5-1.5 MG/5ML syrup Take 5 mLs by mouth every 8 (eight) hours as needed for cough.  Marland Kitchen ipratropium (ATROVENT) 0.03 % nasal spray Place 2 sprays into both nostrils every 12 (twelve) hours.  . Testosterone (ANDROGEL PUMP) 20.25 MG/ACT (1.62%) GEL Place 2 Act onto the skin daily. APPLY 2 PUMPS ONCE DAILY ON SHOULDER OR UPPER ARM    No current facility-administered medications on file prior to visit.    Review of Systems Per HPI unless specifically indicated in ROS section     Objective:    BP 114/80 mmHg  Pulse 70  Temp(Src) 98 F (36.7 C) (Oral)  Wt 406 lb (184.16 kg)  SpO2 97%  Wt Readings from Last 3 Encounters:  07/30/15 406 lb (184.16 kg)  07/23/15 406 lb 4 oz (184.274 kg)  07/08/15 411 lb (186.428 kg)    Physical Exam  Constitutional: He appears well-developed and well-nourished. No distress.  HENT:  Head: Normocephalic and atraumatic.  Right Ear: Hearing, tympanic membrane, external ear and ear canal normal.  Left Ear: Hearing, tympanic membrane, external ear and ear canal normal.  Nose: Mucosal edema (R>L) present. No rhinorrhea. Right sinus exhibits no maxillary sinus tenderness and no frontal sinus tenderness. Left sinus exhibits no maxillary sinus tenderness and no frontal sinus tenderness.  Mouth/Throat: Uvula is midline, oropharynx is clear and moist and mucous membranes are normal. No oropharyngeal exudate, posterior oropharyngeal edema, posterior oropharyngeal erythema or tonsillar abscesses.  Eyes: Conjunctivae and EOM are normal. Pupils are equal, round, and reactive to light. No scleral icterus.  Neck: Normal range of motion. Neck supple.  Cardiovascular: Normal rate, regular rhythm, normal heart sounds and intact distal pulses.   No murmur heard. Pulmonary/Chest: Effort normal. No respiratory distress. He has no decreased breath sounds. He has wheezes (faint exp R>L). He has no rhonchi. He has no rales.  Lymphadenopathy:    He has no cervical adenopathy.  Skin: Skin is warm and dry. No rash noted.  Nursing note and vitals reviewed.  Results for orders placed or performed in visit on 07/08/15  Influenza A/B  Result Value Ref Range   Influenza A, POC Negative Negative   Influenza B, POC Negative Negative   Dg Chest 2 View  07/24/2015 CLINICAL DATA: Wheezing. Acute bronchitis.  EXAM: CHEST 2 VIEW COMPARISON: 09/10/2013. FINDINGS: Mediastinum and hilar structures normal. Mild left base subsegmental atelectasis and/or infiltrate. No pleural effusion or pneumothorax. No acute bony abnormality. IMPRESSION: Mild left base subsegmental atelectasis and or infiltrate . Electronically Signed By: Marcello Moores Register On: 07/24/2015 16:02     Assessment & Plan:   Problem List Items Addressed This Visit    Exacerbation of RAD (reactive airway disease)    ?persistent asthma exacerbation. Continue albuterol inhaler 2 puffs Q6 hr prn. Provided with prednisone taper to hold on to and fill if no improvement after 1-2d levaquin.  Pt agrees with plan. Consider spirometry when feeling better.      CAP (community acquired pneumonia) - Primary    By xray - deterioration despite zpack, prednisone. Will broaden coverage to levaquin 500mg  daily x 1 wk.  Update if sxs persist or continue to deteriorate.       Relevant Medications   levofloxacin (LEVAQUIN) 500 MG tablet       Follow up plan: Return if symptoms worsen or fail to improve.

## 2015-07-30 NOTE — Assessment & Plan Note (Signed)
By xray - deterioration despite zpack, prednisone. Will broaden coverage to levaquin 500mg  daily x 1 wk.  Update if sxs persist or continue to deteriorate.

## 2015-07-30 NOTE — Assessment & Plan Note (Addendum)
?  persistent asthma exacerbation. Continue albuterol inhaler 2 puffs Q6 hr prn. Provided with prednisone taper to hold on to and fill if no improvement after 1-2d levaquin.  Pt agrees with plan. Consider spirometry when feeling better.

## 2015-09-08 ENCOUNTER — Ambulatory Visit (INDEPENDENT_AMBULATORY_CARE_PROVIDER_SITE_OTHER): Payer: Commercial Managed Care - PPO | Admitting: Primary Care

## 2015-09-08 VITALS — BP 124/80 | HR 71 | Temp 98.1°F | Wt >= 6400 oz

## 2015-09-08 DIAGNOSIS — R05 Cough: Secondary | ICD-10-CM | POA: Diagnosis not present

## 2015-09-08 DIAGNOSIS — R0602 Shortness of breath: Secondary | ICD-10-CM

## 2015-09-08 DIAGNOSIS — R059 Cough, unspecified: Secondary | ICD-10-CM

## 2015-09-08 MED ORDER — PREDNISONE 20 MG PO TABS: ORAL_TABLET | ORAL | Status: DC

## 2015-09-08 MED ORDER — GUAIFENESIN-CODEINE 100-10 MG/5ML PO SYRP: 5.0000 mL | ORAL_SOLUTION | Freq: Three times a day (TID) | ORAL | Status: DC | PRN

## 2015-09-08 MED ORDER — AZITHROMYCIN 250 MG PO TABS: ORAL_TABLET | ORAL | Status: DC

## 2015-09-08 NOTE — Progress Notes (Signed)
Pre visit review using our clinic review tool, if applicable. No additional management support is needed unless otherwise documented below in the visit note. 

## 2015-09-08 NOTE — Patient Instructions (Signed)
Start Azithromycin antibiotics. Take 2 tablets by mouth today, then 1 tablet daily for 4 additional days.  Fill Prednisone in 2-3 days if no improvement with Zpak.  You may take the Cheratussin AC cough suppressant three times daily as needed for cough and rest. Caution this medication contains codeine and will make you feel drowsy.  Please call me if no improvement in 3-4 days.  It was a pleasure to see you today!

## 2015-09-08 NOTE — Progress Notes (Signed)
Subjective:    Patient ID: Brandon Parker, male    DOB: [birth year], 45 y.o.   MRN: [id number]  HPI  Mr. Rinderknecht is a 45 year old male who presents today with a chief complaint of cough. His symptoms include chills, body aches, night sweats, nasal congestion, fatigue, low grade fevers intermittently.  His symptoms have been present since Friday afternoon at work. He's taken some Hycodan (old Rx) Friday and Saturday night, and Mucinex with temporary improvement. He was diagnosed with CAP in mid January and treated with Levaquin and prednisone. He has a history of reactive airway disease.  Review of Systems  Constitutional: Positive for fever, chills and fatigue.  HENT: Positive for congestion. Negative for ear pain and sore throat.   Respiratory: Positive for cough and wheezing.   Cardiovascular: Negative for chest pain.       Past Medical History  Diagnosis Date  . History of chicken pox   . Esophagitis 1992  . Fracture, humerus, open 2011  . Skull fracture ([place]) 2011  . Morbid obesity ([place])   . Pulmonary nodules 2011    per CT scan [facility] - reviewed records and no mention of this  . Gout   . Secondary male hypogonadism 2015    nl SHBG, low free and tot T, nl LH/FSH    Social History   Social History  . Marital Status: Single    Spouse Name: N/A  . Number of Children: N/A  . Years of Education: N/A   Occupational History  . Not on file.   Social History Main Topics  . Smoking status: Never Smoker   . Smokeless tobacco: Never Used  . Alcohol Use: 0.0 oz/week    0 Standard drinks or equivalent per week     Comment: Occasional  . Drug Use: No  . Sexual Activity:    Partners: Female   Other Topics Concern  . Not on file   Social History Narrative   Caffeine: [employer] energy 2-3/day   Lives with mother, daughter, son   Stays with GF.   Occupation: Doctor, hospital   Activity: occasional exercise bike   Diet: good water, seldom fruits/vegetables, good  protein, 1600 cal/day diet, daily red meat, rare fish    Past Surgical History  Procedure Laterality Date  . Orif humerus fracture  2011    hardware, incomplete healing, incomplete elbow motion  . Hiatal hernia repair  1990    esophagitis (nissen?)  . Vasectomy  1999    Family History  Problem Relation Age of Onset  . Diabetes Mother   . Clotting disorder Mother     ITP  . Cancer Father 8    thyroid  . Cancer Paternal Aunt 102    lung, smoker  . Cancer Paternal Uncle 96    lung, smoker  . Coronary artery disease Maternal Grandmother 60  . Stroke Maternal Grandmother   . Cancer Maternal Grandfather 50    colon  . Asthma Daughter   . Asthma Son     No Known Allergies  Current Outpatient Prescriptions on File Prior to Visit  Medication Sig Dispense Refill  . albuterol (PROVENTIL HFA;VENTOLIN HFA) 108 (90 BASE) MCG/ACT inhaler Inhale 2 puffs into the lungs every 6 (six) hours as needed for wheezing. 1 Inhaler 1  . cetirizine (ZYRTEC) 10 MG tablet Take 10 mg by mouth daily.    Marland Kitchen ipratropium (ATROVENT) 0.03 % nasal spray Place 2 sprays into both nostrils every 12 (twelve) hours.  30 mL 0  . Testosterone (ANDROGEL PUMP) 20.25 MG/ACT (1.62%) GEL Place 2 Act onto the skin daily. APPLY 2 PUMPS ONCE DAILY ON SHOULDER OR UPPER ARM 75 g 3   No current facility-administered medications on file prior to visit.    BP 124/80 mmHg  Pulse 71  Temp(Src) 98.1 F (36.7 C) (Oral)  Wt 408 lb (185.068 kg)  SpO2 93%    Objective:   Physical Exam  Constitutional: He appears ill.  HENT:  Right Ear: Tympanic membrane and ear canal normal.  Left Ear: Tympanic membrane and ear canal normal.  Nose: Mucosal edema present. Right sinus exhibits maxillary sinus tenderness. Right sinus exhibits no frontal sinus tenderness. Left sinus exhibits maxillary sinus tenderness. Left sinus exhibits no frontal sinus tenderness.  Mouth/Throat: Oropharynx is clear and moist.  Eyes: Conjunctivae are normal.   Neck: Neck supple.  Cardiovascular: Normal rate and regular rhythm.   Pulmonary/Chest: Effort normal. He has no decreased breath sounds. He has no wheezes. He has rhonchi in the right upper field, the right lower field, the left upper field and the left lower field.  Lymphadenopathy:    He has cervical adenopathy.  Skin: Skin is warm.  Mildly diaphoretic          Assessment & Plan:  URI vs. Exacerbation of RAD:  Cough, fatigue, fevers, body aches x 3 days. History of CAP in mid January, reports this feels the same. Productive cough with yellow/green sputum. Exam with moderate rhonchi throughout, no wheezing or decreased sounds. Appears ill. Due to presentation and examination, will treat empirically for possible bacterial involvement. Zpak and Cheratussin AC provided. Printed prednisone to use in 2-3 days if develops wheezing/shortness of breath. Continue albuterol inhaler PRN. Increase fluids and rest. Return precautions provided.

## 2015-12-11 ENCOUNTER — Telehealth: Payer: Self-pay

## 2015-12-11 MED ORDER — COLCHICINE 0.6 MG PO TABS: 0.6000 mg | ORAL_TABLET | Freq: Every day | ORAL | Status: DC | PRN

## 2015-12-11 NOTE — Telephone Encounter (Signed)
Sent in colchicine. May take 2 on day one followed by one daily as needed for gout flare. If no better, come in for evaluation. Also would suggest schedule CPE with labs as due.

## 2015-12-11 NOTE — Telephone Encounter (Signed)
Patient notified. He will call back to schedule a physical soon. He is short staffed at work and should be able to schedule something in the next month or two.

## 2015-12-11 NOTE — Telephone Encounter (Signed)
Pt left v/m requesting rx for gout flare up in left foot; last annual when gout discussed 11/29/13. Pt cannot schedule appt because cannot leave work today. Pt request cb.no future appt scheduled. Has had several acute visits since 11/29/13 appt.Please advise. [facility] [place].

## 2016-03-18 ENCOUNTER — Ambulatory Visit: Payer: Commercial Managed Care - PPO | Admitting: Family Medicine

## 2016-03-18 ENCOUNTER — Telehealth: Payer: Self-pay | Admitting: Family Medicine

## 2016-03-18 DIAGNOSIS — Z0289 Encounter for other administrative examinations: Secondary | ICD-10-CM

## 2016-03-18 NOTE — Telephone Encounter (Signed)
Patient did not come in for their appointment today for [facility] follow up  Please let me know if patient needs to be contacted immediately for follow up or no follow up needed.

## 2016-03-18 NOTE — Telephone Encounter (Signed)
No f/u at this time. 

## 2016-03-19 DIAGNOSIS — R3129 Other microscopic hematuria: Secondary | ICD-10-CM

## 2016-03-19 HISTORY — DX: Other microscopic hematuria: R31.29

## 2016-04-10 ENCOUNTER — Encounter: Payer: Self-pay | Admitting: Family Medicine

## 2016-09-06 LAB — BASIC METABOLIC PANEL
CREATININE: 0.8 (ref <=1.3)
GLUCOSE: 165
Potassium: 4.5 (ref 3.4–5.3)
Sodium: 141 (ref 137–147)

## 2016-09-06 LAB — HEPATIC FUNCTION PANEL
ALT: 29 (ref 10–40)
AST: 20 (ref 14–40)
Alkaline Phosphatase: 89 (ref 25–125)
Bilirubin, Total: 0.4

## 2016-09-06 LAB — CBC AND DIFFERENTIAL
Hemoglobin: 14.8 (ref 13.5–17.5)
WBC: 9.3

## 2016-09-06 LAB — HEMOGLOBIN A1C: Hemoglobin A1C: 5.9

## 2016-10-26 ENCOUNTER — Encounter: Payer: Self-pay | Admitting: Family Medicine

## 2016-10-26 DIAGNOSIS — R3129 Other microscopic hematuria: Secondary | ICD-10-CM | POA: Insufficient documentation

## 2016-10-26 DIAGNOSIS — R609 Edema, unspecified: Secondary | ICD-10-CM | POA: Insufficient documentation

## 2017-01-12 ENCOUNTER — Ambulatory Visit (INDEPENDENT_AMBULATORY_CARE_PROVIDER_SITE_OTHER): Payer: Commercial Managed Care - PPO | Admitting: Internal Medicine

## 2017-01-12 ENCOUNTER — Encounter: Payer: Self-pay | Admitting: Internal Medicine

## 2017-01-12 VITALS — BP 136/84 | HR 81 | Temp 98.0°F | Wt >= 6400 oz

## 2017-01-12 DIAGNOSIS — J301 Allergic rhinitis due to pollen: Secondary | ICD-10-CM

## 2017-01-12 MED ORDER — ALBUTEROL SULFATE HFA 108 (90 BASE) MCG/ACT IN AERS: 2.0000 | INHALATION_SPRAY | Freq: Four times a day (QID) | RESPIRATORY_TRACT | 1 refills | Status: DC | PRN

## 2017-01-12 NOTE — Progress Notes (Signed)
HPI  Pt presents to the clinic today with c/o runny nose, post nasal drip and cough. He reports this started 8 days ago. He is blowing thick clear mucous out of his nose. The cough is productive of clear mucous. He denies fever, chills or body aches. He has tried Tylenol Cold and Sinus and Zyrtec with minimal relief. He has no history of allergies or breathing problems. He has had sick contacts.  Review of Systems        Past Medical History:  Diagnosis Date  . Esophagitis 1992  . Fracture, humerus, open 2011  . Gout   . History of chicken pox   . Microhematuria 03/2016   s/p unrevealing uro eval (CT, cystoscopy) by Dr Felipa Eth  . Morbid obesity ([place])   . Pulmonary nodules 2011   per CT scan [facility] - reviewed records and no mention of this  . Secondary male hypogonadism 2015   nl SHBG, low free and tot T, nl LH/FSH  . Skull fracture ([place]) 2011    Family History  Problem Relation Age of Onset  . Diabetes Mother   . Clotting disorder Mother        ITP  . Cancer Father 32       thyroid  . Cancer Paternal Aunt 84       lung, smoker  . Cancer Paternal Uncle 19       lung, smoker  . Coronary artery disease Maternal Grandmother 60  . Stroke Maternal Grandmother   . Cancer Maternal Grandfather 50       colon  . Asthma Daughter   . Asthma Son     Social History   Social History  . Marital status: Single    Spouse name: N/A  . Number of children: N/A  . Years of education: N/A   Occupational History  . Not on file.   Social History Main Topics  . Smoking status: Never Smoker  . Smokeless tobacco: Never Used  . Alcohol use 0.0 oz/week     Comment: Occasional  . Drug use: No  . Sexual activity: Yes    Partners: Female   Other Topics Concern  . Not on file   Social History Narrative   Caffeine: [employer] energy 2-3/day   Lives with mother, daughter, son   Stays with GF.   Occupation: Doctor, hospital   Activity: occasional exercise bike   Diet: good  water, seldom fruits/vegetables, good protein, 1600 cal/day diet, daily red meat, rare fish    No Known Allergies   Constitutional: Denies headache, fatigue, fever or abrupt weight changes.  HEENT:  Positive runny nose. Denies eye redness, eye pain, pressure behind the eyes, facial pain, nasal congestion, ear pain, ringing in the ears, wax buildup, or sore throat. Respiratory: Positive cough. Denies difficulty breathing or shortness of breath.  Cardiovascular: Denies chest pain, chest tightness, palpitations or swelling in the hands or feet.   No other specific complaints in a complete review of systems (except as listed in HPI above).  Objective:   BP 136/84   Pulse 81   Temp 98 F (36.7 C) (Oral)   Wt (!) 435 lb (197.3 kg)   SpO2 96%   BMI 57.39 kg/m  Wt Readings from Last 3 Encounters:  01/12/17 (!) 435 lb (197.3 kg)  09/08/15 (!) 408 lb (185.1 kg)  07/30/15 (!) 406 lb (184.2 kg)     General: Appears his stated age, obese in NAD. HEENT: Head: normal shape and  size, no sinus tenderness noted; Ears: Tm's gray and intact, normal light reflex; Nose: mucosa pink and moist, septum midline; Throat/Mouth: + PND. Teeth present, mucosa erythematous and moist, no exudate noted, no lesions or ulcerations noted.  Neck: No cervical lymphadenopathy.  Pulmonary/Chest: Normal effort and positive vesicular breath sounds with intermittent expiratory wheeze. No respiratory distress. No rales or ronchi noted.       Assessment & Plan:   Allergic Rhinitis:  Get some rest and drink plenty of water Advised him to continue Zyrtec Add in Flonase nasal spray Albuterol inhaler refilled today 80 mg Depo IM today  RTC as needed or if symptoms persist.   Webb Silversmith, NP

## 2017-01-12 NOTE — Patient Instructions (Signed)
Allergic Rhinitis Allergic rhinitis is when the mucous membranes in the nose respond to allergens. Allergens are particles in the air that cause your body to have an allergic reaction. This causes you to release allergic antibodies. Through a chain of events, these eventually cause you to release histamine into the blood stream. Although meant to protect the body, it is this release of histamine that causes your discomfort, such as frequent sneezing, congestion, and an itchy, runny nose. What are the causes? Seasonal allergic rhinitis (hay fever) is caused by pollen allergens that may come from grasses, trees, and weeds. Year-round allergic rhinitis (perennial allergic rhinitis) is caused by allergens such as house dust mites, pet dander, and mold spores. What are the signs or symptoms?  Nasal stuffiness (congestion).  Itchy, runny nose with sneezing and tearing of the eyes. How is this diagnosed? Your health care provider can help you determine the allergen or allergens that trigger your symptoms. If you and your health care provider are unable to determine the allergen, skin or blood testing may be used. Your health care provider will diagnose your condition after taking your health history and performing a physical exam. Your health care provider may assess you for other related conditions, such as asthma, pink eye, or an ear infection. How is this treated? Allergic rhinitis does not have a cure, but it can be controlled by:  Medicines that block allergy symptoms. These may include allergy shots, nasal sprays, and oral antihistamines.  Avoiding the allergen. Hay fever may often be treated with antihistamines in pill or nasal spray forms. Antihistamines block the effects of histamine. There are over-the-counter medicines that may help with nasal congestion and swelling around the eyes. Check with your health care provider before taking or giving this medicine. If avoiding the allergen or the  medicine prescribed do not work, there are many new medicines your health care provider can prescribe. Stronger medicine may be used if initial measures are ineffective. Desensitizing injections can be used if medicine and avoidance does not work. Desensitization is when a patient is given ongoing shots until the body becomes less sensitive to the allergen. Make sure you follow up with your health care provider if problems continue. Follow these instructions at home: It is not possible to completely avoid allergens, but you can reduce your symptoms by taking steps to limit your exposure to them. It helps to know exactly what you are allergic to so that you can avoid your specific triggers. Contact a health care provider if:  You have a fever.  You develop a cough that does not stop easily (persistent).  You have shortness of breath.  You start wheezing.  Symptoms interfere with normal daily activities. This information is not intended to replace advice given to you by your health care provider. Make sure you discuss any questions you have with your health care provider. Document Released: 03/30/2001 Document Revised: 03/05/2016 Document Reviewed: 03/12/2013 Elsevier Interactive Patient Education  2017 Elsevier Inc.  

## 2017-01-20 ENCOUNTER — Other Ambulatory Visit: Payer: Self-pay

## 2017-01-20 NOTE — Telephone Encounter (Signed)
Pt left v/m; pt has gout flare up in lt big toe; last seen for acute 01/12/17 and last f/u 07/30/15. No future appt scheduled. [facility]chicine last filled # 30 on 12/11/15.[facility] [place].

## 2017-01-21 MED ORDER — COLCHICINE 0.6 MG PO TABS: 0.6000 mg | ORAL_TABLET | Freq: Every day | ORAL | 0 refills | Status: DC | PRN

## 2017-01-21 NOTE — Telephone Encounter (Signed)
Pt notified med sent to pharmacy and pt voiced understanding.

## 2017-03-28 ENCOUNTER — Ambulatory Visit (INDEPENDENT_AMBULATORY_CARE_PROVIDER_SITE_OTHER): Payer: Commercial Managed Care - PPO | Admitting: Internal Medicine

## 2017-03-28 ENCOUNTER — Encounter: Payer: Self-pay | Admitting: Internal Medicine

## 2017-03-28 VITALS — BP 132/82 | HR 70 | Temp 98.2°F | Wt >= 6400 oz

## 2017-03-28 DIAGNOSIS — J00 Acute nasopharyngitis [common cold]: Secondary | ICD-10-CM

## 2017-03-28 MED ORDER — AZITHROMYCIN 250 MG PO TABS: ORAL_TABLET | ORAL | 0 refills | Status: DC

## 2017-03-28 NOTE — Progress Notes (Signed)
HPI  Pt presents to the clinic today with c/o nasal congestion and cough. He reports this started 1 week ago. He is blowing white mucous out of his nose. The cough is productive of white mucous. He has mild shortness of breath. He denies ear pain, sore throat. He denies fever, chills or body aches. He has taken Mucinex D and Zyrtec with minimal relief. He called employee health on Wednesday, and was prescribed Prednisone, and reports little improvement with that. He denies allergies or breathing problems. He has not had sick contacts that he is aware of.  Review of Systems        Past Medical History:  Diagnosis Date  . Esophagitis 1992  . Fracture, humerus, open 2011  . Gout   . History of chicken pox   . Microhematuria 03/2016   s/p unrevealing uro eval (CT, cystoscopy) by Dr Felipa Eth  . Morbid obesity ([place])   . Pulmonary nodules 2011   per CT scan [facility] - reviewed records and no mention of this  . Secondary male hypogonadism 2015   nl SHBG, low free and tot T, nl LH/FSH  . Skull fracture ([place]) 2011    Family History  Problem Relation Age of Onset  . Diabetes Mother   . Clotting disorder Mother        ITP  . Cancer Father 76       thyroid  . Cancer Paternal Aunt 32       lung, smoker  . Cancer Paternal Uncle 34       lung, smoker  . Coronary artery disease Maternal Grandmother 60  . Stroke Maternal Grandmother   . Cancer Maternal Grandfather 50       colon  . Asthma Daughter   . Asthma Son     Social History   Social History  . Marital status: Single    Spouse name: N/A  . Number of children: N/A  . Years of education: N/A   Occupational History  . Not on file.   Social History Main Topics  . Smoking status: Never Smoker  . Smokeless tobacco: Never Used  . Alcohol use 0.0 oz/week     Comment: Occasional  . Drug use: No  . Sexual activity: Yes    Partners: Female   Other Topics Concern  . Not on file   Social History Narrative   Caffeine: [employer]  energy 2-3/day   Lives with mother, daughter, son   Stays with GF.   Occupation: Doctor, hospital   Activity: occasional exercise bike   Diet: good water, seldom fruits/vegetables, good protein, 1600 cal/day diet, daily red meat, rare fish    No Known Allergies   Constitutional:  Denies headache, fatigue, fever or  abrupt weight changes.  HEENT:  Positive nasal congestion. Denies eye redness, eye pain, pressure behind the eyes, facial pain, ear pain, ringing in the ears, wax buildup, runny nose or bloody nose. Respiratory: Positive cough, shortness of breath. Denies difficulty breathing.    No other specific complaints in a complete review of systems (except as listed in HPI above).  Objective:  BP 132/82   Pulse 70   Temp 98.2 F (36.8 C) (Oral)   Wt (!) 435 lb (197.3 kg)   SpO2 97%   BMI 57.39 kg/m   Wt Readings from Last 3 Encounters:  03/28/17 (!) 435 lb (197.3 kg)  01/12/17 (!) 435 lb (197.3 kg)  09/08/15 (!) 408 lb (185.1 kg)     General:  Appears his stated age, in NAD. HEENT: Head: normal shape and size, no sinus; Throat/Mouth: + PND. Teeth present, mucosa erythematous and moist, no exudate noted, no lesions or ulcerations noted.  Neck: No cervical lymphadenopathy.  Pulmonary/Chest: Normal effort and positive vesicular breath sounds. No respiratory distress. No wheezes, rales or ronchi noted.       Assessment & Plan:   Upper Respiratory Infection:  Get some rest and drink plenty of water Do salt water gargles for the sore throat eRx for Azithromax x 5 days He has Tessalon as needed for cough  RTC as needed or if symptoms persist.   Webb Silversmith, NP

## 2017-03-28 NOTE — Patient Instructions (Signed)
Upper Respiratory Infection, Adult Most upper respiratory infections (URIs) are caused by a virus. A URI affects the nose, throat, and upper air passages. The most common type of URI is often called "the common cold." Follow these instructions at home:  Take medicines only as told by your doctor.  Gargle warm saltwater or take cough drops to comfort your throat as told by your doctor.  Use a warm mist humidifier or inhale steam from a shower to increase air moisture. This may make it easier to breathe.  Drink enough fluid to keep your pee (urine) clear or pale yellow.  Eat soups and other clear broths.  Have a healthy diet.  Rest as needed.  Go back to work when your fever is gone or your doctor says it is okay. ? You may need to stay home longer to avoid giving your URI to others. ? You can also wear a face mask and wash your hands often to prevent spread of the virus.  Use your inhaler more if you have asthma.  Do not use any tobacco products, including cigarettes, chewing tobacco, or electronic cigarettes. If you need help quitting, ask your doctor. Contact a doctor if:  You are getting worse, not better.  Your symptoms are not helped by medicine.  You have chills.  You are getting more short of breath.  You have brown or red mucus.  You have yellow or brown discharge from your nose.  You have pain in your face, especially when you bend forward.  You have a fever.  You have puffy (swollen) neck glands.  You have pain while swallowing.  You have white areas in the back of your throat. Get help right away if:  You have very bad or constant: ? Headache. ? Ear pain. ? Pain in your forehead, behind your eyes, and over your cheekbones (sinus pain). ? Chest pain.  You have long-lasting (chronic) lung disease and any of the following: ? Wheezing. ? Long-lasting cough. ? Coughing up blood. ? A change in your usual mucus.  You have a stiff neck.  You have  changes in your: ? Vision. ? Hearing. ? Thinking. ? Mood. This information is not intended to replace advice given to you by your health care provider. Make sure you discuss any questions you have with your health care provider. Document Released: 12/22/2007 Document Revised: 03/07/2016 Document Reviewed: 10/10/2013 Elsevier Interactive Patient Education  2018 Elsevier Inc.  

## 2017-03-29 ENCOUNTER — Ambulatory Visit: Payer: Commercial Managed Care - PPO | Admitting: Family Medicine

## 2017-04-14 ENCOUNTER — Encounter: Payer: Self-pay | Admitting: Family Medicine

## 2017-04-14 ENCOUNTER — Ambulatory Visit (INDEPENDENT_AMBULATORY_CARE_PROVIDER_SITE_OTHER): Payer: Commercial Managed Care - PPO | Admitting: Family Medicine

## 2017-04-14 VITALS — BP 122/80 | HR 75 | Temp 98.0°F | Wt >= 6400 oz

## 2017-04-14 DIAGNOSIS — R609 Edema, unspecified: Secondary | ICD-10-CM

## 2017-04-14 DIAGNOSIS — Z6841 Body Mass Index (BMI) 40.0 and over, adult: Secondary | ICD-10-CM | POA: Diagnosis not present

## 2017-04-14 DIAGNOSIS — R0600 Dyspnea, unspecified: Secondary | ICD-10-CM

## 2017-04-14 DIAGNOSIS — R7303 Prediabetes: Secondary | ICD-10-CM | POA: Diagnosis not present

## 2017-04-14 DIAGNOSIS — R0609 Other forms of dyspnea: Secondary | ICD-10-CM

## 2017-04-14 DIAGNOSIS — R0981 Nasal congestion: Secondary | ICD-10-CM | POA: Diagnosis not present

## 2017-04-14 LAB — BASIC METABOLIC PANEL
BUN: 13 mg/dL (ref 6–23)
CHLORIDE: 101 meq/L (ref 96–112)
CO2: 33 mEq/L — ABNORMAL HIGH (ref 19–32)
CREATININE: 0.81 mg/dL (ref 0.40–1.50)
Calcium: 9.2 mg/dL (ref 8.4–10.5)
GFR: 108.91 mL/min (ref >60.00)
GLUCOSE: 89 mg/dL (ref 70–99)
POTASSIUM: 4.5 meq/L (ref 3.5–5.1)
Sodium: 139 mEq/L (ref 135–145)

## 2017-04-14 LAB — TSH: TSH: 3.28 u[IU]/mL (ref 0.35–4.50)

## 2017-04-14 LAB — BRAIN NATRIURETIC PEPTIDE: Pro B Natriuretic peptide (BNP): 31 pg/mL (ref 0.0–100.0)

## 2017-04-14 NOTE — Assessment & Plan Note (Signed)
Ongoing. Continue zyrtec and flonase.

## 2017-04-14 NOTE — Assessment & Plan Note (Signed)
Noted by nephrology on latest labwork.

## 2017-04-14 NOTE — Assessment & Plan Note (Signed)
Check for CHF as cause with echo.

## 2017-04-14 NOTE — Assessment & Plan Note (Addendum)
Notes progressively worsening exertional dyspnea over last few months. Saw nephrology - concern for OSA vs R heart CHF related.  Check EKG today. Check labs today (TSH, BMP, BNP). Pending results, may start lasix. Recent Cr and CBC WNL (10/2016).  Refer to sleep study, given abnormal EKG, check echocardiogram to further evaluate for congestive heart faliure as cause of symptoms.  Reviewed weight gain effect on sleep apnea and heart failure. Continue to encourage weight loss.

## 2017-04-14 NOTE — Progress Notes (Signed)
BP 122/80 (BP Location: Left Arm, Patient Position: Sitting, Cuff Size: Large)   Pulse 75   Temp 98 F (36.7 C) (Oral)   Wt (!) 441 lb (200 kg)   SpO2 95%   BMI 58.18 kg/m    CC: renal f/u Subjective:    Patient ID: Brandon Parker, male    DOB: 1971/03/21, 46 y.o.   MRN: [id number]  HPI: Dodge Ator is a 46 y.o. male presenting on 04/14/2017 for Follow-up (Nephrololgy follow-up. Suggest referral for sleep apnea and ECHO) and Cough (Intermittent with clear mucous. Says nephrology was concerned about water retention and CHF)   I last saw patient 07/2015.   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 Treated with 1 month lasix 20mg  without significant improvement.   Ongoing weight gain noted.  Earlier this month treated for URI with prednisone course (phone visit with work [employer]) then [employer] (saw [place] at our office). Ongoing episodes of head and chest congestion, post nasal drainage and cough, dyspnea with mild wheezing. These episodes are intermittent and last several minutes until he clears mucous - at other times feels well.  Notes progressive exertional dyspnea over the past month (worse while on prednisone).  No fevers/chills, chest pain, dizziness.   40 lb weight gain since I last saw patient 07/2015.   Unsure if he snores. No witnessed apnea. Paroxysmal nocturnal dyspnea x1 last year, not recently. Some daytime somnolence. Feels rested when he wakes up.   Relevant past medical, surgical, family and social history reviewed and updated as indicated. Interim medical history since our last visit reviewed. Allergies and medications reviewed and updated. Outpatient Medications Prior to Visit  Medication Sig Dispense Refill  . albuterol (PROVENTIL HFA;VENTOLIN HFA) 108 (90 Base) MCG/ACT inhaler Inhale 2 puffs into the lungs every 6 (six) hours as needed  for wheezing. 1 Inhaler 1  . benzonatate (TESSALON) 100 MG capsule take 1 capsule by mouth three times a day AS NEEDED FOR COUGH  0  . cetirizine (ZYRTEC) 10 MG tablet Take 10 mg by mouth 3 times/day as needed-between meals & bedtime.     . colchicine 0.6 MG tablet Take 1 tablet (0.6 mg total) by mouth daily as needed. (may take 2 on first day) 30 tablet 0  . azithromycin (ZITHROMAX) 250 MG tablet Take 2 tabs today, then 1 tab daily x 4 days 6 tablet 0   No facility-administered medications prior to visit.      Per HPI unless specifically indicated in ROS section below Review of Systems     Objective:    BP 122/80 (BP Location: Left Arm, Patient Position: Sitting, Cuff Size: Large)   Pulse 75   Temp 98 F (36.7 C) (Oral)   Wt (!) 441 lb (200 kg)   SpO2 95%   BMI 58.18 kg/m   Wt Readings from Last 3 Encounters:  04/14/17 (!) 441 lb (200 kg)  03/28/17 (!) 435 lb (197.3 kg)  01/12/17 (!) 435 lb (197.3 kg)    Physical Exam  Constitutional: He appears well-developed and well-nourished. No distress.  HENT:  Head: Normocephalic and atraumatic.  Right Ear: Hearing, tympanic membrane, external ear and ear canal normal.  Left Ear: Hearing, tympanic membrane, external ear and ear canal normal.  Nose: Nose normal. No mucosal edema or rhinorrhea. Right sinus exhibits no maxillary sinus tenderness and no frontal sinus tenderness. Left sinus exhibits no maxillary sinus  tenderness and no frontal sinus tenderness.  Mouth/Throat: Uvula is midline, oropharynx is clear and moist and mucous membranes are normal. No oropharyngeal exudate, posterior oropharyngeal edema, posterior oropharyngeal erythema or tonsillar abscesses.  Eyes: Pupils are equal, round, and reactive to light. Conjunctivae and EOM are normal. No scleral icterus.  Neck: Normal range of motion. Neck supple.  Cardiovascular: Normal rate, regular rhythm, normal heart sounds and intact distal pulses.   No murmur  heard. Pulmonary/Chest: Effort normal. No respiratory distress. He has no decreased breath sounds. He has wheezes (mild bilateral lower lobes). He has no rales.  Musculoskeletal: He exhibits edema (tr pitting, moderate nonpitting).  Lymphadenopathy:    He has no cervical adenopathy.  Skin: Skin is warm and dry. No rash noted.  Psychiatric: He has a normal mood and affect.  Nursing note and vitals reviewed.  EKG - NSR rate 70, normal axis, intervals, no acute ST/T changes, possible Q waves anteriorly without contiguous ST changes  ESS = 10    Assessment & Plan:   Problem List Items Addressed This Visit    Body mass index (BMI) of 50-59.9 in adult [facility])    Reviewed deteriorated weight gain. Activity currently limited by exertional dyspnea. See above. May need to consider bariatric surgery pending workup. ESS = 10. Endorses episode of waking up gasping for breath last year as well as ongoing mild daytime somnolence. Body habitus increases risk of OSA - will refer.       Relevant Orders   Ambulatory referral to Pulmonology   ECHOCARDIOGRAM COMPLETE   Exertional dyspnea - Primary    Notes progressively worsening exertional dyspnea over last few months. Saw nephrology - concern for OSA vs R heart CHF related.  Check EKG today. Check labs today (TSH, BMP, BNP). Pending results, may start lasix. Recent Cr and CBC WNL (10/2016).  Refer to sleep study, given abnormal EKG, check echocardiogram to further evaluate for congestive heart faliure as cause of symptoms.  Reviewed weight gain effect on sleep apnea and heart failure. Continue to encourage weight loss.       Relevant Orders   EKG 12-Lead (Completed)   Brain natriuretic peptide   Basic metabolic panel   TSH   Ambulatory referral to Pulmonology   ECHOCARDIOGRAM COMPLETE   Nasal congestion    Ongoing. Continue zyrtec and flonase.       Peripheral edema    Check for CHF as cause with echo.       Relevant Orders   Brain  natriuretic peptide   Basic metabolic panel   TSH   ECHOCARDIOGRAM COMPLETE   Prediabetes    Noted by nephrology on latest labwork.           Follow up plan: Return in about 3 months (around 07/14/2017), or if symptoms worsen or fail to improve, for follow up visit.  Ria Bush, MD

## 2017-04-14 NOTE — Patient Instructions (Addendum)
EKG today. Labs today. We may set you up for echocardiogram pending results.  Sleepiness questionairre provided today. We will refer you to sleep doctor.  Start using albuterol inhaler as needed for shortness of breath.

## 2017-04-14 NOTE — Assessment & Plan Note (Addendum)
Reviewed deteriorated weight gain. Activity currently limited by exertional dyspnea. See above. May need to consider bariatric surgery pending workup. ESS = 10. Endorses episode of waking up gasping for breath last year as well as ongoing mild daytime somnolence. Body habitus increases risk of OSA - will refer.

## 2017-04-19 ENCOUNTER — Other Ambulatory Visit: Payer: Self-pay | Admitting: Family Medicine

## 2017-04-19 DIAGNOSIS — R609 Edema, unspecified: Secondary | ICD-10-CM

## 2017-04-19 DIAGNOSIS — R600 Localized edema: Secondary | ICD-10-CM

## 2017-04-19 DIAGNOSIS — R0609 Other forms of dyspnea: Principal | ICD-10-CM

## 2017-04-25 ENCOUNTER — Encounter: Payer: Self-pay | Admitting: Pulmonary Disease

## 2017-04-25 ENCOUNTER — Ambulatory Visit (INDEPENDENT_AMBULATORY_CARE_PROVIDER_SITE_OTHER): Payer: Commercial Managed Care - PPO | Admitting: Pulmonary Disease

## 2017-04-25 VITALS — BP 118/78 | HR 78 | Ht 72.0 in | Wt >= 6400 oz

## 2017-04-25 DIAGNOSIS — R29818 Other symptoms and signs involving the nervous system: Secondary | ICD-10-CM | POA: Diagnosis not present

## 2017-04-25 DIAGNOSIS — Z6841 Body Mass Index (BMI) 40.0 and over, adult: Secondary | ICD-10-CM | POA: Diagnosis not present

## 2017-04-25 NOTE — Patient Instructions (Signed)
Will arrange for home sleep study Will call to arrange for follow up after sleep study reviewed  

## 2017-04-25 NOTE — Progress Notes (Signed)
   Subjective:    Patient ID: Brandon Parker, male    DOB: 1971/07/14, 46 y.o.   MRN: [id number]  HPI    Review of Systems  Constitutional: Negative for fever and unexpected weight change.  HENT: Negative for congestion, dental problem, ear pain, nosebleeds, postnasal drip, rhinorrhea, sinus pressure, sneezing, sore throat and trouble swallowing.   Eyes: Negative for redness and itching.  Respiratory: Positive for cough, shortness of breath and wheezing. Negative for chest tightness.   Cardiovascular: Positive for palpitations and leg swelling.  Gastrointestinal: Negative for nausea and vomiting.  Genitourinary: Negative for dysuria.  Musculoskeletal: Negative for joint swelling.  Skin: Negative for rash.  Allergic/Immunologic: Negative.  Negative for environmental allergies, food allergies and immunocompromised state.  Neurological: Negative for headaches.  Hematological: Does not bruise/bleed easily.  Psychiatric/Behavioral: Negative for dysphoric mood. The patient is not nervous/anxious.        Objective:   Physical Exam        Assessment & Plan:

## 2017-04-25 NOTE — Progress Notes (Signed)
Past Surgical History He  has a past surgical history that includes ORIF humerus fracture (2011); Hiatal hernia repair (1990); and Vasectomy (1999).  No Known Allergies  Family History His family history includes Asthma in his daughter and son; Cancer (age of onset: 30) in his maternal grandfather and paternal aunt; Cancer (age of onset: 5) in his father; Cancer (age of onset: 72) in his paternal uncle; Clotting disorder in his mother; Coronary artery disease (age of onset: 32) in his maternal grandmother; Diabetes in his mother; Stroke in his maternal grandmother.  Social History He  reports that he has never smoked. He has never used smokeless tobacco. He reports that he drinks alcohol. He reports that he does not use drugs.  Review of systems Constitutional: Negative for fever and unexpected weight change.  HENT: Negative for congestion, dental problem, ear pain, nosebleeds, postnasal drip, rhinorrhea, sinus pressure, sneezing, sore throat and trouble swallowing.   Eyes: Negative for redness and itching.  Respiratory: Positive for cough, shortness of breath and wheezing. Negative for chest tightness.   Cardiovascular: Positive for palpitations and leg swelling.  Gastrointestinal: Negative for nausea and vomiting.  Genitourinary: Negative for dysuria.  Musculoskeletal: Negative for joint swelling.  Skin: Negative for rash.  Allergic/Immunologic: Negative.  Negative for environmental allergies, food allergies and immunocompromised state.  Neurological: Negative for headaches.  Hematological: Does not bruise/bleed easily.  Psychiatric/Behavioral: Negative for dysphoric mood. The patient is not nervous/anxious.     Current Outpatient Prescriptions on File Prior to Visit  Medication Sig  . albuterol (PROVENTIL HFA;VENTOLIN HFA) 108 (90 Base) MCG/ACT inhaler Inhale 2 puffs into the lungs every 6 (six) hours as needed for wheezing.  . colchicine 0.6 MG tablet Take 1 tablet (0.6 mg total)  by mouth daily as needed. (may take 2 on first day)   No current facility-administered medications on file prior to visit.     Chief Complaint  Patient presents with  . Sleep Consult    Pt referred by Dr. Danise Mina MD. Pt has edema in both legs; pt having echocardiogram this Wednesday. Pt wakes up every 3 hrs during the night to go the restroom. Sleepy during the day and doesn't sleep well each night.    Past medical history He  has a past medical history of CAP (community acquired pneumonia) (07/08/2014); Esophagitis (1992); Fracture, humerus, open (2011); Gout; History of chicken pox; Microhematuria (03/2016); Morbid obesity ([place]); Pulmonary nodules (2011); Secondary male hypogonadism (2015); and Skull fracture ([place]) (2011).  Vital signs BP 118/78 (BP Location: Left Arm, Cuff Size: Normal)   Pulse 78   Ht 6' (1.829 m)   Wt (!) 439 lb 6.4 oz (199.3 kg)   SpO2 99%   BMI 59.59 kg/m   History of Present Illness [place] is a 46 y.o. male for evaluation of sleep problems.  He was seen by an nephrologist for hematuria.  He was noted to have leg swelling.  He was advised this could be related to breathing issues while asleep.  He snores, and will wake up feeling like he is choked.  He can't sleep on his back.  He will fall asleep during lunch and when watching TV.    He goes to sleep at 11 pm.  He falls asleep after few minutes.  He wakes up 2 times to use the bathroom.  He gets out of bed at 630 am.  He feels okay in the morning.  He denies morning headache.  He does not use anything to  help him fall sleep or stay awake.  He denies sleep walking, sleep talking, bruxism, or nightmares.  There is no history of restless legs.  He denies sleep hallucinations, sleep paralysis, or cataplexy.  The Epworth score is 15 out of 24.   Physical Exam:  General - No distress Eyes - pupils reactive ENT - No sinus tenderness, no oral exudate, no LAN, no thyromegaly, TM clear, pupils  equal/reactive, MP 3, 2+ tonsils, low laying soft palate Cardiac - s1s2 regular, no murmur, pulses symmetric Chest - No wheeze/rales/dullness, good air entry, normal respiratory excursion Back - No focal tenderness Abd - Soft, non-tender, no organomegaly, + bowel sounds Ext - 2+ edema Neuro - Normal strength, cranial nerves intact Skin - No rashes Psych - Normal mood, and behavior  Discussion: He has snoring, sleep disruption, witnessed apnea, and daytime sleepiness.  His BMI is > 35.  I am concerned he could have sleep apnea.  We discussed how sleep apnea can affect various health problems, including risks for hypertension, cardiovascular disease, and diabetes.  We also discussed how sleep disruption can increase risks for accidents, such as while driving.  Weight loss as a means of improving sleep apnea was also reviewed.  Additional treatment options discussed were CPAP therapy, oral appliance, and surgical intervention.   Assessment/plan:  Suspected sleep apnea. - will arrange for home sleep study  Obesity. - discussed importance of weight loss   Patient Instructions  Will arrange for home sleep study Will call to arrange for follow up after sleep study reviewed     [place], M.D. Pager [phone] 04/25/2017, 4:15 PM

## 2017-04-27 ENCOUNTER — Ambulatory Visit (INDEPENDENT_AMBULATORY_CARE_PROVIDER_SITE_OTHER): Payer: Commercial Managed Care - PPO

## 2017-04-27 ENCOUNTER — Other Ambulatory Visit: Payer: Self-pay

## 2017-04-27 DIAGNOSIS — R600 Localized edema: Secondary | ICD-10-CM

## 2017-04-27 DIAGNOSIS — R609 Edema, unspecified: Secondary | ICD-10-CM | POA: Diagnosis not present

## 2017-04-27 DIAGNOSIS — R0609 Other forms of dyspnea: Secondary | ICD-10-CM | POA: Diagnosis not present

## 2017-05-09 ENCOUNTER — Ambulatory Visit (INDEPENDENT_AMBULATORY_CARE_PROVIDER_SITE_OTHER): Payer: Commercial Managed Care - PPO | Admitting: Family Medicine

## 2017-05-09 ENCOUNTER — Encounter: Payer: Self-pay | Admitting: Family Medicine

## 2017-05-09 VITALS — BP 136/84 | HR 80 | Temp 98.3°F | Wt >= 6400 oz

## 2017-05-09 DIAGNOSIS — M25522 Pain in left elbow: Secondary | ICD-10-CM | POA: Diagnosis not present
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
## 2017-05-09 NOTE — Assessment & Plan Note (Signed)
He does have pain at insertion site of biceps tendon on proximal forearm as well as concerning mechanism of injury - I recommend ortho eval. Referral placed.

## 2017-05-10 ENCOUNTER — Other Ambulatory Visit: Payer: Self-pay | Admitting: Sports Medicine

## 2017-05-10 DIAGNOSIS — S46219S Strain of muscle, fascia and tendon of other parts of biceps, unspecified arm, sequela: Secondary | ICD-10-CM

## 2017-05-13 ENCOUNTER — Ambulatory Visit
Admission: RE | Admit: 2017-05-13 | Discharge: 2017-05-13 | Disposition: A | Discharge status: Home / Self Care | Payer: Commercial Managed Care - PPO | Source: Ambulatory Visit | Attending: Sports Medicine | Admitting: Sports Medicine

## 2017-05-13 DIAGNOSIS — S46219S Strain of muscle, fascia and tendon of other parts of biceps, unspecified arm, sequela: Secondary | ICD-10-CM

## 2017-05-18 DIAGNOSIS — G4733 Obstructive sleep apnea (adult) (pediatric): Secondary | ICD-10-CM | POA: Diagnosis not present

## 2017-05-19 ENCOUNTER — Telehealth: Payer: Self-pay | Admitting: Pulmonary Disease

## 2017-05-19 ENCOUNTER — Telehealth: Payer: Self-pay | Admitting: Family Medicine

## 2017-05-19 DIAGNOSIS — G4733 Obstructive sleep apnea (adult) (pediatric): Secondary | ICD-10-CM | POA: Diagnosis not present

## 2017-05-19 DIAGNOSIS — R0609 Other forms of dyspnea: Principal | ICD-10-CM

## 2017-05-19 NOTE — Telephone Encounter (Signed)
HST 05/18/17 >> AHI 37.2, SaO2 low 77%.   Will have my nurse inform pt that sleep study shows severe sleep apnea.  Options are 1) CPAP now, 2) ROV first.  If He is agreeable to CPAP, then please send order for auto CPAP range 5 to 15 cm H2O with heated humidity and mask of choice.  Have download sent 1 month after starting CPAP and set up ROV 2 months after starting CPAP.  ROV can be with me or NP.

## 2017-05-19 NOTE — Pre-Procedure Instructions (Signed)
Br[place] Parker  05/19/2017      [id number] [name], [place] - [employer] [place] [address] Phone: [phone] Fax: [phone]    Your procedure is scheduled on Wednesday November 7.  Report to [facility] Admitting at 6:30 A.M.  Call this number if you have problems the morning of surgery:  [phone]   Remember:  Do not eat food or drink liquids after midnight.  Take these medicines the morning of surgery with A SIP OF WATER: colchicine if needed, albuterol if needed (please bring inhaler to hospital with you)  7 days prior to surgery STOP taking any Aspirin (unless otherwise instructed by your surgeon), Aleve, Naproxen, Ibuprofen, Motrin, Advil, Goody's, BC's, all herbal medications, fish oil, and all vitamins    Do not wear jewelry, make-up or nail polish.  Do not wear lotions, powders, or perfumes, or deoderant.  Do not shave 48 hours prior to surgery.  Men may shave face and neck.  Do not bring valuables to the hospital.  [facility] is not responsible for any belongings or valuables.  Contacts, dentures or bridgework may not be worn into surgery.  Leave your suitcase in the car.  After surgery it may be brought to your room.  For patients admitted to the hospital, discharge time will be determined by your treatment team.  Patients discharged the day of surgery will not be allowed to drive home.   Special instructions:    [facility]- Preparing For Surgery  Before surgery, you can play an important role. Because skin is not sterile, your skin needs to be as free of germs as possible. You can reduce the number of germs on your skin by washing with CHG (chlorahexidine gluconate) Soap before surgery.  CHG is an antiseptic cleaner which kills germs and bonds with the skin to continue killing germs even after washing.  Please do not use if you have an allergy to CHG or antibacterial soaps. If your  skin becomes reddened/irritated stop using the CHG.  Do not shave (including legs and underarms) for at least 48 hours prior to first CHG shower. It is OK to shave your face.  Please follow these instructions carefully.   1. Shower the NIGHT BEFORE SURGERY and the MORNING OF SURGERY with CHG.   2. If you chose to wash your hair, wash your hair first as usual with your normal shampoo.  3. After you shampoo, rinse your hair and body thoroughly to remove the shampoo.  4. Use CHG as you would any other liquid soap. You can apply CHG directly to the skin and wash gently with a scrungie or a clean washcloth.   5. Apply the CHG Soap to your body ONLY FROM THE NECK DOWN.  Do not use on open wounds or open sores. Avoid contact with your eyes, ears, mouth and genitals (private parts). Wash Face and genitals (private parts)  with your normal soap.  6. Wash thoroughly, paying special attention to the area where your surgery will be performed.  7. Thoroughly rinse your body with warm water from the neck down.  8. DO NOT shower/wash with your normal soap after using and rinsing off the CHG Soap.  9. Pat yourself dry with a CLEAN TOWEL.  10. Wear CLEAN PAJAMAS to bed the night before surgery, wear comfortable clothes the morning of surgery  11. Place CLEAN SHEETS on your bed the night of your first shower and DO  NOT SLEEP WITH PETS.    Day of Surgery: Do not apply any deodorants/lotions. Please wear clean clothes to the hospital/surgery center.      Please read over the following fact sheets that you were given. Coughing and Deep Breathing and Surgical Site Infection Prevention

## 2017-05-19 NOTE — Telephone Encounter (Signed)
Called patient and gave him the Cardiology Appt for tomorrow with Dr Debara Pickett at 10:30am. Patient did a Home Sleep Study last night and dropped off the box today. Faxed clearance form to Sherri at Dr Rich Fuchs office and called her to let her know when the Cardiology Appt was and who it was with.

## 2017-05-19 NOTE — Telephone Encounter (Addendum)
I received preop clearance for surgery 05/25/2017 from D Varkey.  We were in process of working up exertional dyspnea, but now needs this expedited.  I do recommend cardiology clearance prior to surgery. Referral placed, will see if we can expedite.  I believe sleep study is pending - has this been done yet?  Form placed in my out box.

## 2017-05-19 NOTE — Telephone Encounter (Signed)
Noted  

## 2017-05-20 ENCOUNTER — Encounter (HOSPITAL_COMMUNITY)
Admission: RE | Admit: 2017-05-20 | Discharge: 2017-05-20 | Disposition: A | Discharge status: Home / Self Care | Payer: Commercial Managed Care - PPO | Source: Ambulatory Visit | Attending: Orthopaedic Surgery | Admitting: Orthopaedic Surgery

## 2017-05-20 ENCOUNTER — Ambulatory Visit (INDEPENDENT_AMBULATORY_CARE_PROVIDER_SITE_OTHER): Payer: Commercial Managed Care - PPO | Admitting: Internal Medicine

## 2017-05-20 ENCOUNTER — Encounter: Payer: Self-pay | Admitting: Internal Medicine

## 2017-05-20 ENCOUNTER — Encounter (HOSPITAL_COMMUNITY): Payer: Self-pay

## 2017-05-20 VITALS — BP 142/86 | HR 81 | Ht 72.0 in | Wt >= 6400 oz

## 2017-05-20 DIAGNOSIS — G4733 Obstructive sleep apnea (adult) (pediatric): Secondary | ICD-10-CM

## 2017-05-20 DIAGNOSIS — Z01810 Encounter for preprocedural cardiovascular examination: Secondary | ICD-10-CM | POA: Diagnosis not present

## 2017-05-20 DIAGNOSIS — Z6841 Body Mass Index (BMI) 40.0 and over, adult: Secondary | ICD-10-CM

## 2017-05-20 DIAGNOSIS — Z01812 Encounter for preprocedural laboratory examination: Secondary | ICD-10-CM | POA: Diagnosis not present

## 2017-05-20 HISTORY — DX: Personal history of other diseases of the digestive system: Z87.19

## 2017-05-20 HISTORY — DX: Prediabetes: R73.03

## 2017-05-20 HISTORY — DX: Sleep apnea, unspecified: G47.30

## 2017-05-20 LAB — BASIC METABOLIC PANEL
ANION GAP: 9 (ref 5–15)
BUN: 12 mg/dL (ref 6–20)
CALCIUM: 9.2 mg/dL (ref 8.9–10.3)
CO2: 24 mmol/L (ref 22–32)
Chloride: 102 mmol/L (ref 101–111)
Creatinine, Ser: 0.9 mg/dL (ref 0.61–1.24)
GLUCOSE: 182 mg/dL — AB (ref 65–99)
POTASSIUM: 4.2 mmol/L (ref 3.5–5.1)
SODIUM: 135 mmol/L (ref 135–145)

## 2017-05-20 LAB — CBC
HCT: 45.3 % (ref 39.0–52.0)
Hemoglobin: 15.4 g/dL (ref 13.0–17.0)
MCH: 30.1 pg (ref 26.0–34.0)
MCHC: 34 g/dL (ref 30.0–36.0)
MCV: 88.6 fL (ref 78.0–100.0)
PLATELETS: 229 10*3/uL (ref 150–400)
RBC: 5.11 MIL/uL (ref 4.22–5.81)
RDW: 13.2 % (ref 11.5–15.5)
WBC: 12.1 10*3/uL — AB (ref 4.0–10.5)

## 2017-05-20 LAB — HEMOGLOBIN A1C
HEMOGLOBIN A1C: 5.8 % — AB (ref 4.8–5.6)
MEAN PLASMA GLUCOSE: 119.76 mg/dL

## 2017-05-20 LAB — GLUCOSE, CAPILLARY: GLUCOSE-CAPILLARY: 191 mg/dL — AB (ref 65–99)

## 2017-05-20 NOTE — Telephone Encounter (Signed)
Called and spoke with patient today regarding results per vs.  Informed the patient of his results today and recommendations per vs. The patient verbalized understanding and denied any questions or concerns at this time.  Patient is wanting to think about getting a CPAP machine, he is not wanting to use the machine each night and waste money or insurance resources for something he fills that he may or may not use daily. Patient is scheduled for arm surgery next week, and will call afterwards to make ROV with VS to discuss further.

## 2017-05-20 NOTE — H&P (Signed)
PREOPERATIVE H&P  Chief Complaint: LEFT DISTAL BICEPS RUPTURE  HPI: Brandon Parker is a 46 y.o. male who presents for preoperative history and physical with a diagnosis of LEFT DISTAL BICEPS RUPTURE. Symptoms are rated as moderate to severe, and have been worsening.  This is significantly impairing activities of daily living.  He has elected for surgical management.   Past Medical History:  Diagnosis Date  . CAP (community acquired pneumonia) 07/08/2014  . Esophagitis 1992  . Fracture, humerus, open 2011  . Gout   . History of chicken pox   . Microhematuria 03/2016   s/p unrevealing uro eval (CT, cystoscopy) by Dr Felipa Eth. 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 Morbid obesity ([place])   . Pulmonary nodules 2011   per CT scan [facility] - reviewed records and no mention of this  . Secondary male hypogonadism 2015   nl SHBG, low free and tot T, nl LH/FSH  . Skull fracture ([place]) 2011   Past Surgical History:  Procedure Laterality Date  . HIATAL HERNIA REPAIR  1990   esophagitis (nissen?)  . ORIF HUMERUS FRACTURE  2011   hardware, incomplete healing, incomplete elbow motion  . VASECTOMY  1999   Social History   Social History  . Marital status: Single    Spouse name: N/A  . Number of children: N/A  . Years of education: N/A   Social History Main Topics  . Smoking status: Never Smoker  . Smokeless tobacco: Never Used  . Alcohol use 0.0 oz/week     Comment: Occasional  . Drug use: No  . Sexual activity: Yes    Partners: Female   Other Topics Concern  . Not on file   Social History Narrative   Caffeine: [employer] 2-3/day   Lives with mother, daughter, son   Stays with GF.   Occupation: Doctor, hospital   Activity: occasional exercise bike   Diet: good water, seldom fruits/vegetables, good protein, 1600 cal/day diet, daily red meat, rare fish   Family History  Problem Relation Age of Onset  . Diabetes Mother   . Clotting disorder Mother        ITP  .  Cancer Father 41       thyroid  . Cancer Paternal Aunt 21       lung, smoker  . Cancer Paternal Uncle 36       lung, smoker  . Coronary artery disease Maternal Grandmother 60  . Stroke Maternal Grandmother   . Cancer Maternal Grandfather 50       colon  . Asthma Daughter   . Asthma Son    No Known Allergies Prior to Admission medications   Medication Sig Start Date End Date Taking? Authorizing Provider  albuterol (PROVENTIL HFA;VENTOLIN HFA) 108 (90 Base) MCG/ACT inhaler Inhale 2 puffs into the lungs every 6 (six) hours as needed for wheezing. 01/12/17 02/23/19 Yes Baity, Coralie Keens, NP  colchicine 0.6 MG tablet Take 1 tablet (0.6 mg total) by mouth daily as needed. (may take 2 on first day) Patient taking differently: Take 0.6 mg by mouth daily as needed (GOUT). (may take 2 on first day) 01/21/17  Yes Ria Bush, MD  ibuprofen (ADVIL,MOTRIN) 200 MG tablet Take 600 mg by mouth every 8 (eight) hours as needed for mild pain.   Yes [provider]     Positive ROS: All other systems have been reviewed and were otherwise negative with the exception of those mentioned in the HPI and as above.  Physical Exam: General: Alert, no acute distress Cardiovascular: No pedal edema Respiratory: No cyanosis, no use of accessory musculature GI: No organomegaly, abdomen is soft and non-tender Skin: No lesions in the area of chief complaint Neurologic: Sensation intact distally Psychiatric: Patient is competent for consent with normal mood and affect Lymphatic: No axillary or cervical lymphadenopathy  MUSCULOSKELETAL: L arm: distal biceps asymmetry, positive hook test, wwp hand.  Assessment: LEFT DISTAL BICEPS RUPTURE  Plan: Plan for Procedure(s): LEFT DISTAL BICEPS TENDON REPAIR  The risks benefits and alternatives were discussed with the patient including but not limited to the risks of nonoperative treatment, versus surgical intervention including infection, bleeding, nerve  injury,  blood clots, cardiopulmonary complications, morbidity, mortality, among others, and they were willing to proceed.   Hiram Gash, MD  05/20/2017 11:15 AM

## 2017-05-20 NOTE — Pre-Procedure Instructions (Addendum)
Brandon Parker  05/20/2017      [facility] [place] [address] Phone: [phone] Fax: [phone]    Your procedure is scheduled on 05/25/17  Report to [facility] Admitting at 630 A.M.  Call this number if you have problems the morning of surgery:  [phone]   Remember:  Do not eat food or drink liquids after midnight.  Take these medicines the morning of surgery with A SIP OF WATER    Inhaler if needed(bring with you)  STOP all herbel meds, nsaids (aleve,naproxen,advil,ibuprofen)  Starting today 05/20/17 including all vitamins/supplements,aspirin, fish oil   Do not wear jewelry, make-up or nail polish.  Do not wear lotions, powders, or perfumes, or deoderant.  Do not shave 48 hours prior to surgery.  Men may shave face and neck.  Do not bring valuables to the hospital.  [facility] is not responsible for any belongings or valuables.  Contacts, dentures or bridgework may not be worn into surgery.  Leave your suitcase in the car.  After surgery it may be brought to your room.  For patients admitted to the hospital, discharge time will be determined by your treatment team.  Patients discharged the day of surgery will not be allowed to drive home.      Special instructions:   Special Instructions: [facility] - Preparing for Surgery  Before surgery, you can play an important role.  Because skin is not sterile, your skin needs to be as free of germs as possible.  You can reduce the number of germs on you skin by washing with CHG (chlorahexidine gluconate) soap before surgery.  CHG is an antiseptic cleaner which kills germs and bonds with the skin to continue killing germs even after washing.  Please DO NOT use if you have an allergy to CHG or antibacterial soaps.  If your skin becomes reddened/irritated stop using the CHG and inform your nurse when you arrive at Short  Stay.  Do not shave (including legs and underarms) for at least 48 hours prior to the first CHG shower.  You may shave your face.  Please follow these instructions carefully:   1.  Shower with CHG Soap the night before surgery and the morning of Surgery.  2.  If you choose to wash your hair, wash your hair first as usual with your normal shampoo.  3.  After you shampoo, rinse your hair and body thoroughly to remove the Shampoo.  4.  Use CHG as you would any other liquid soap.  You can apply chg directly  to the skin and wash gently with scrungie or a clean washcloth.  5.  Apply the CHG Soap to your body ONLY FROM THE NECK DOWN.  Do not use on open wounds or open sores.  Avoid contact with your eyes ears, mouth and genitals (private parts).  Wash genitals (private parts)       with your normal soap.  6.  Wash thoroughly, paying special attention to the area where your surgery will be performed.  7.  Thoroughly rinse your body with warm water from the neck down.  8.  DO NOT shower/wash with your normal soap after using and rinsing off the CHG Soap.  9.  Pat yourself dry with a clean towel.            10.  Wear clean pajamas.  11.  Place clean sheets on your bed the night of your first shower and do not sleep with pets.  Day of Surgery  Do not apply any lotions/deodorants the morning of surgery.  Please wear clean clothes to the hospital/surgery center.  Please read over the fact sheets that you were given.

## 2017-05-20 NOTE — Patient Instructions (Addendum)
Your physician recommends that you schedule a follow-up appointment with Dr. Debara Pickett as needed.

## 2017-05-22 ENCOUNTER — Encounter: Payer: Self-pay | Admitting: Internal Medicine

## 2017-05-22 NOTE — Progress Notes (Signed)
OFFICE CONSULT NOTE  Chief Complaint:  Preoperative risk assessment  Primary Care Physician: Ria Bush, MD  HPI:  Brandon Parker is a 46 y.o. male who is being seen today for the evaluation of preoperative risk assessment at the request of Ria Bush, MD.  This is a pleasant 46 year old near super morbidly obese male with a past medical history significant for obstructive sleep apnea, community-acquired pneumonia, pulmonary nodules, but no known diabetes, significant hypertension or other cardiac history.  Although he is somewhat sedentary, he reports he can walk up a flight of stairs without any shortness of breath and perform at least 4 metabolic equivalents of activity.  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  There is some you alcohol use however very mild.  Unfortunately he recently tore the left distal biceps and is scheduled for an upcoming repair.  He was sent for preoperative risk assessment.  PMHx:  Past Medical History:  Diagnosis Date  . CAP (community acquired pneumonia) 07/08/2014  . Esophagitis 1992  . Fracture, humerus, open 2011  . Gout   . History of chicken pox   . History of hiatal hernia    s/p  . Microhematuria 03/2016   s/p unrevealing uro eval (CT, cystoscopy) by Dr Felipa Eth. 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 Morbid obesity ([place])   . Pre-diabetes   . Pulmonary nodules 2011   per CT scan [facility] - reviewed records and no mention of this  . Secondary male hypogonadism 2015   nl SHBG, low free and tot T, nl LH/FSH  . Skull fracture ([place]) 2011  . Sleep apnea    home study 05/19/17 does not results yet    Past Surgical History:  Procedure Laterality Date  . HIATAL HERNIA REPAIR  1990   esophagitis (nissen?)  . ORIF HUMERUS FRACTURE Right 2011   hardware, incomplete healing, incomplete elbow motion  . VASECTOMY  1999    FAMHx:    Family History  Problem Relation Age of Onset  . Diabetes Mother   . Clotting disorder Mother        ITP  . Cancer Father 98       thyroid  . Cancer Paternal Aunt 22       lung, smoker  . Cancer Paternal Uncle 40       lung, smoker  . Coronary artery disease Maternal Grandmother 60  . Stroke Maternal Grandmother   . Cancer Maternal Grandfather 50       colon  . Asthma Daughter   . Asthma Son     SOCHx:   reports that  has never smoked. he has never used smokeless tobacco. He reports that he drinks alcohol. He reports that he does not use drugs.  ALLERGIES:  No Known Allergies  ROS: Pertinent items noted in HPI and remainder of comprehensive ROS otherwise negative.  HOME MEDS: Current Outpatient Medications on File Prior to Visit  Medication Sig Dispense Refill  . albuterol (PROVENTIL HFA;VENTOLIN HFA) 108 (90 Base) MCG/ACT inhaler Inhale 2 puffs into the lungs every 6 (six) hours as needed for wheezing. 1 Inhaler 1  . colchicine 0.6 MG tablet Take 1 tablet (0.6 mg total) by mouth daily as needed. (may take 2 on first day) (Patient taking differently: Take 0.6 mg by mouth daily as needed (GOUT). (may take 2 on first day)) 30 tablet 0  .  ibuprofen (ADVIL,MOTRIN) 200 MG tablet Take 600 mg by mouth every 8 (eight) hours as needed for mild pain.     No current facility-administered medications on file prior to visit.     LABS/IMAGING: No results found for this or any previous visit (from the past 48 hour(s)). No results found.  LIPID PANEL:    Component Value Date/Time   CHOL 217 (H) 04/24/2015 0754   TRIG 82.0 04/24/2015 0754   HDL 47.30 04/24/2015 0754   CHOLHDL 5 04/24/2015 0754   VLDL 16.4 04/24/2015 0754   LDLCALC 153 (H) 04/24/2015 0754    WEIGHTS: Wt Readings from Last 3 Encounters:  05/20/17 (!) 430 lb (195 kg)  05/20/17 (!) 429 lb (194.6 kg)  05/09/17 (!) 435 lb (197.3 kg)    VITALS: BP (!) 142/86   Pulse 81   Ht 6' (1.829 m)   Wt (!) 429 lb  (194.6 kg)   BMI 58.18 kg/m   EXAM: General appearance: alert and no distress Neck: no carotid bruit and no JVD Lungs: diminished breath sounds bilaterally Heart: regular rate and Brandon Abdomen: Morbidly obese, soft, nontender Extremities: edema 1+ bilateral edema Pulses: 2+ and symmetric Skin: Distal chronic venous stasis changes Neurologic: Grossly normal Psych: Pleasant  EKG: Sinus Brandon with poor R wave progression at 68 (from 04/14/2017)- personally reviewed  ASSESSMENT: 1. Abnormal EKG likely secondary to body habitus 2. Acceptable risk for surgery based on good metabolic performance 3. Near super morbid obesity 4. Likely obstructive sleep apnea  PLAN: 1.   Mr. Minahan needs significant weight reduction and treatment for obstructive sleep apnea.  He will need a formal sleep study if his home sleep study was abnormal.  Based on his exercise performance, he should be acceptable risk for surgery.  His EKG does not indicate old infarct rather R changes secondary to his significant body habitus.  He is at high risk of cardiac events long-term if he continues to carry this much weight.  Unfortunately he has had prior surgery for hiatal hernia and esophageal reflux.  He may not be a candidate for bariatric surgery, but said he successfully lost a significant amount of weight in the years past during a weight loss challenge.  Thanks for the kind referral.  Follow-up with me as necessary.  Pixie Casino, MD, [place]  Attending Cardiologist  Direct Dial: [phone]  Fax: [phone]  Website:  [email or web address] Osgood Mickenzie Stolar 05/22/2017, 2:32 PM

## 2017-05-23 ENCOUNTER — Encounter (HOSPITAL_COMMUNITY): Payer: Self-pay

## 2017-05-23 NOTE — Progress Notes (Signed)
Anesthesia Chart Review:  Pt is a 46 year old male scheduled for L distal biceps tendon repair on 05/25/2017 with Ophelia Charter, MD  - PCP is Ria Bush, MD - Saw cardiologist Lyman Bishop, MD 05/20/17 for pre-op eval and was cleared for surgery.   PMH includes:  OSA, pre-diabetes. Never smoker. BMI 58  Medications include: albuterol  BP (!) 145/84   Pulse 85   Temp 36.8 C (Oral)   Resp 20   Ht 6' (1.829 m)   Wt (!) 430 lb (195 kg)   SpO2 97%   BMI 58.32 kg/m   Preoperative labs reviewed.   - Hba1c 5.8, glucose 182  EKG 04/14/17: Sinus Rhythm. Poor R-wave progression -nonspecific -consider old anterior infarct.   Echo 04/28/17:  - Left ventricle: The cavity size was normal. There was mild concentric hypertrophy. Systolic function was normal. The estimated ejection fraction was in the range of 55% to 60%. Wall motion was normal; there were no regional wall motion abnormalities. Left ventricular diastolic function parameters were normal. - Left atrium: The atrium was mildly dilated. - Pulmonary arteries: Systolic pressure could not be accurately estimated.  If no changes, I anticipate pt can proceed with surgery as scheduled.   Willeen Cass, FNP-BC [facility] Short Stay Surgical [facility]/Anesthesiology Phone: [phone] 05/23/2017 11:12 AM

## 2017-05-24 MED ORDER — CEFAZOLIN SODIUM 10 G IJ SOLR
3.0000 g | INTRAMUSCULAR | Status: AC
  Filled 2017-05-24 (×2): qty 3000

## 2017-05-25 ENCOUNTER — Ambulatory Visit (HOSPITAL_COMMUNITY)
Admission: RE | Admit: 2017-05-25 | Discharge: 2017-05-26 | Disposition: A | Discharge status: Home / Self Care | Payer: Commercial Managed Care - PPO | Source: Ambulatory Visit | Attending: Orthopaedic Surgery | Admitting: Orthopaedic Surgery

## 2017-05-25 ENCOUNTER — Encounter (HOSPITAL_COMMUNITY)
Admission: RE | Disposition: A | Discharge status: Home / Self Care | Payer: Self-pay | Source: Ambulatory Visit | Attending: Orthopaedic Surgery

## 2017-05-25 ENCOUNTER — Ambulatory Visit (HOSPITAL_COMMUNITY): Payer: Commercial Managed Care - PPO | Admitting: Certified Registered"

## 2017-05-25 ENCOUNTER — Ambulatory Visit (HOSPITAL_COMMUNITY): Payer: Commercial Managed Care - PPO | Admitting: Emergency Medicine

## 2017-05-25 ENCOUNTER — Other Ambulatory Visit: Payer: Self-pay

## 2017-05-25 ENCOUNTER — Encounter (HOSPITAL_COMMUNITY): Payer: Self-pay

## 2017-05-25 DIAGNOSIS — X58XXXA Exposure to other specified factors, initial encounter: Secondary | ICD-10-CM | POA: Insufficient documentation

## 2017-05-25 DIAGNOSIS — M109 Gout, unspecified: Secondary | ICD-10-CM | POA: Diagnosis not present

## 2017-05-25 DIAGNOSIS — Z79899 Other long term (current) drug therapy: Secondary | ICD-10-CM | POA: Insufficient documentation

## 2017-05-25 DIAGNOSIS — Z791 Long term (current) use of non-steroidal anti-inflammatories (NSAID): Secondary | ICD-10-CM | POA: Diagnosis not present

## 2017-05-25 DIAGNOSIS — Y9289 Other specified places as the place of occurrence of the external cause: Secondary | ICD-10-CM | POA: Diagnosis not present

## 2017-05-25 DIAGNOSIS — S46212A Strain of muscle, fascia and tendon of other parts of biceps, left arm, initial encounter: Secondary | ICD-10-CM | POA: Diagnosis present

## 2017-05-25 HISTORY — PX: DISTAL BICEPS TENDON REPAIR: SHX1461

## 2017-05-25 SURGERY — REPAIR, TENDON, BICEPS, DISTAL
Anesthesia: General | Site: Arm Upper | Laterality: Left

## 2017-05-25 MED ORDER — OXYCODONE-ACETAMINOPHEN 5-325 MG PO TABS: 1.0000 | ORAL_TABLET | ORAL | Status: DC | PRN

## 2017-05-25 MED ORDER — PROPOFOL 10 MG/ML IV BOLUS
INTRAVENOUS | Status: DC | PRN
  Administered 2017-05-25: 230 mg via INTRAVENOUS

## 2017-05-25 MED ORDER — SUGAMMADEX SODIUM 500 MG/5ML IV SOLN
INTRAVENOUS | Status: AC
  Filled 2017-05-25: qty 5

## 2017-05-25 MED ORDER — SUGAMMADEX SODIUM 500 MG/5ML IV SOLN
INTRAVENOUS | Status: DC | PRN
  Administered 2017-05-25: 390 mg via INTRAVENOUS

## 2017-05-25 MED ORDER — DEXAMETHASONE SODIUM PHOSPHATE 10 MG/ML IJ SOLN
INTRAMUSCULAR | Status: AC
  Filled 2017-05-25: qty 1

## 2017-05-25 MED ORDER — OXYCODONE HCL 5 MG PO TABS: 5.0000 mg | ORAL_TABLET | Freq: Once | ORAL | Status: DC | PRN

## 2017-05-25 MED ORDER — ALBUTEROL SULFATE (2.5 MG/3ML) 0.083% IN NEBU: 2.5000 mg | INHALATION_SOLUTION | Freq: Four times a day (QID) | RESPIRATORY_TRACT | Status: DC | PRN

## 2017-05-25 MED ORDER — ACETAMINOPHEN 500 MG PO TABS: ORAL_TABLET | ORAL | 2 refills | Status: DC

## 2017-05-25 MED ORDER — DEXAMETHASONE SODIUM PHOSPHATE 10 MG/ML IJ SOLN
INTRAMUSCULAR | Status: DC | PRN
  Administered 2017-05-25: 10 mg via INTRAVENOUS

## 2017-05-25 MED ORDER — HYDROMORPHONE HCL 1 MG/ML IJ SOLN
INTRAMUSCULAR | Status: AC
  Filled 2017-05-25: qty 1

## 2017-05-25 MED ORDER — ROCURONIUM BROMIDE 10 MG/ML (PF) SYRINGE
PREFILLED_SYRINGE | INTRAVENOUS | Status: DC | PRN
  Administered 2017-05-25: 30 mg via INTRAVENOUS
  Administered 2017-05-25: 50 mg via INTRAVENOUS
  Administered 2017-05-25: 20 mg via INTRAVENOUS

## 2017-05-25 MED ORDER — CEFAZOLIN SODIUM-DEXTROSE 2-3 GM-%(50ML) IV SOLR
INTRAVENOUS | Status: DC | PRN
  Administered 2017-05-25: 3 g via INTRAVENOUS

## 2017-05-25 MED ORDER — ONDANSETRON HCL 4 MG/2ML IJ SOLN
INTRAMUSCULAR | Status: DC | PRN
  Administered 2017-05-25: 4 mg via INTRAVENOUS

## 2017-05-25 MED ORDER — METOCLOPRAMIDE HCL 5 MG PO TABS: 5.0000 mg | ORAL_TABLET | Freq: Three times a day (TID) | ORAL | Status: DC | PRN

## 2017-05-25 MED ORDER — METOCLOPRAMIDE HCL 5 MG/ML IJ SOLN: 5.0000 mg | Freq: Three times a day (TID) | INTRAMUSCULAR | Status: DC | PRN

## 2017-05-25 MED ORDER — MIDAZOLAM HCL 5 MG/5ML IJ SOLN
INTRAMUSCULAR | Status: DC | PRN
  Administered 2017-05-25: 2 mg via INTRAVENOUS

## 2017-05-25 MED ORDER — LIDOCAINE 2% (20 MG/ML) 5 ML SYRINGE
INTRAMUSCULAR | Status: DC | PRN
  Administered 2017-05-25: 100 mg via INTRAVENOUS

## 2017-05-25 MED ORDER — ONDANSETRON HCL 4 MG/2ML IJ SOLN
4.0000 mg | Freq: Four times a day (QID) | INTRAMUSCULAR | Status: DC | PRN
  Administered 2017-05-25: 4 mg via INTRAVENOUS
  Filled 2017-05-25: qty 2

## 2017-05-25 MED ORDER — MELOXICAM 7.5 MG PO TABS: 7.5000 mg | ORAL_TABLET | Freq: Every day | ORAL | 2 refills | Status: AC

## 2017-05-25 MED ORDER — ONDANSETRON HCL 4 MG/2ML IJ SOLN
INTRAMUSCULAR | Status: AC
  Filled 2017-05-25: qty 2

## 2017-05-25 MED ORDER — OXYCODONE HCL 5 MG PO TABS: ORAL_TABLET | ORAL | 0 refills | Status: DC

## 2017-05-25 MED ORDER — COLCHICINE 0.6 MG PO TABS: 0.6000 mg | ORAL_TABLET | Freq: Every day | ORAL | Status: DC | PRN

## 2017-05-25 MED ORDER — ALBUTEROL SULFATE HFA 108 (90 BASE) MCG/ACT IN AERS: 2.0000 | INHALATION_SPRAY | Freq: Four times a day (QID) | RESPIRATORY_TRACT | Status: DC | PRN

## 2017-05-25 MED ORDER — CHLORHEXIDINE GLUCONATE 4 % EX LIQD: 60.0000 mL | Freq: Once | CUTANEOUS | Status: DC

## 2017-05-25 MED ORDER — MIDAZOLAM HCL 2 MG/2ML IJ SOLN
INTRAMUSCULAR | Status: AC
  Filled 2017-05-25: qty 2

## 2017-05-25 MED ORDER — ONDANSETRON HCL 4 MG PO TABS: 4.0000 mg | ORAL_TABLET | Freq: Three times a day (TID) | ORAL | 0 refills | Status: DC | PRN

## 2017-05-25 MED ORDER — LIDOCAINE 2% (20 MG/ML) 5 ML SYRINGE
INTRAMUSCULAR | Status: AC
  Filled 2017-05-25: qty 5

## 2017-05-25 MED ORDER — HYDROMORPHONE HCL 1 MG/ML IJ SOLN
0.2500 mg | INTRAMUSCULAR | Status: DC | PRN
  Administered 2017-05-25: 0.5 mg via INTRAVENOUS

## 2017-05-25 MED ORDER — FENTANYL CITRATE (PF) 250 MCG/5ML IJ SOLN
INTRAMUSCULAR | Status: DC | PRN
  Administered 2017-05-25: 100 ug via INTRAVENOUS
  Administered 2017-05-25: 50 ug via INTRAVENOUS

## 2017-05-25 MED ORDER — 0.9 % SODIUM CHLORIDE (POUR BTL) OPTIME
TOPICAL | Status: DC | PRN
  Administered 2017-05-25: 1000 mL

## 2017-05-25 MED ORDER — PROPOFOL 10 MG/ML IV BOLUS
INTRAVENOUS | Status: AC
  Filled 2017-05-25: qty 40

## 2017-05-25 MED ORDER — ROCURONIUM BROMIDE 10 MG/ML (PF) SYRINGE
PREFILLED_SYRINGE | INTRAVENOUS | Status: AC
  Filled 2017-05-25: qty 5

## 2017-05-25 MED ORDER — SODIUM CHLORIDE 0.9 % IV SOLN: INTRAVENOUS | Status: DC

## 2017-05-25 MED ORDER — LACTATED RINGERS IV SOLN
INTRAVENOUS | Status: DC | PRN
Start: 2017-05-25 — End: 2017-05-25
  Administered 2017-05-25 (×2): via INTRAVENOUS

## 2017-05-25 MED ORDER — ONDANSETRON HCL 4 MG PO TABS: 4.0000 mg | ORAL_TABLET | Freq: Four times a day (QID) | ORAL | Status: DC | PRN

## 2017-05-25 MED ORDER — OXYCODONE HCL 5 MG/5ML PO SOLN: 5.0000 mg | Freq: Once | ORAL | Status: DC | PRN

## 2017-05-25 MED ORDER — FENTANYL CITRATE (PF) 250 MCG/5ML IJ SOLN
INTRAMUSCULAR | Status: AC
  Filled 2017-05-25: qty 5

## 2017-05-25 MED ORDER — BUPIVACAINE HCL (PF) 0.25 % IJ SOLN
INTRAMUSCULAR | Status: DC | PRN
  Administered 2017-05-25: 20 mL

## 2017-05-25 MED ORDER — BUPIVACAINE HCL (PF) 0.25 % IJ SOLN
INTRAMUSCULAR | Status: AC
  Filled 2017-05-25: qty 30

## 2017-05-25 SURGICAL SUPPLY — 61 items
BANDAGE ACE 3X5.8 VEL STRL LF (GAUZE/BANDAGES/DRESSINGS) IMPLANT
BANDAGE ELASTIC 4 VELCRO ST LF (GAUZE/BANDAGES/DRESSINGS) ×3 IMPLANT
BANDAGE ELASTIC 6 VELCRO ST LF (GAUZE/BANDAGES/DRESSINGS) ×3 IMPLANT
BENZOIN TINCTURE PRP APPL 2/3 (GAUZE/BANDAGES/DRESSINGS) ×3 IMPLANT
BNDG COHESIVE 4X5 TAN STRL (GAUZE/BANDAGES/DRESSINGS) ×3 IMPLANT
BNDG ESMARK 4X9 LF (GAUZE/BANDAGES/DRESSINGS) ×3 IMPLANT
BNDG GAUZE ELAST 4 BULKY (GAUZE/BANDAGES/DRESSINGS) IMPLANT
CANISTER SUCT 3000ML PPV (MISCELLANEOUS) ×3 IMPLANT
CLOSURE WOUND 1/2 X4 (GAUZE/BANDAGES/DRESSINGS) ×1
COVER SURGICAL LIGHT HANDLE (MISCELLANEOUS) ×3 IMPLANT
CUFF TOURNIQUET SINGLE 18IN (TOURNIQUET CUFF) IMPLANT
CUFF TOURNIQUET SINGLE 24IN (TOURNIQUET CUFF) ×3 IMPLANT
DECANTER SPIKE VIAL GLASS SM (MISCELLANEOUS) IMPLANT
DURAPREP 26ML APPLICATOR (WOUND CARE) IMPLANT
ELECT CAUTERY BLADE 6.4 (BLADE) ×3 IMPLANT
GAUZE SPONGE 4X4 12PLY STRL (GAUZE/BANDAGES/DRESSINGS) ×6 IMPLANT
GAUZE XEROFORM 1X8 LF (GAUZE/BANDAGES/DRESSINGS) IMPLANT
GLOVE BIO SURGEON STRL SZ7.5 (GLOVE) ×3 IMPLANT
GLOVE BIO SURGEON STRL SZ8 (GLOVE) ×3 IMPLANT
GLOVE BIOGEL PI IND STRL 7.5 (GLOVE) ×1 IMPLANT
GLOVE BIOGEL PI IND STRL 8 (GLOVE) ×1 IMPLANT
GLOVE BIOGEL PI INDICATOR 7.5 (GLOVE) ×2
GLOVE BIOGEL PI INDICATOR 8 (GLOVE) ×2
GOWN STRL REUS W/ TWL LRG LVL3 (GOWN DISPOSABLE) ×3 IMPLANT
GOWN STRL REUS W/TWL LRG LVL3 (GOWN DISPOSABLE) ×6
KIT BASIN OR (CUSTOM PROCEDURE TRAY) ×3 IMPLANT
KIT ROOM TURNOVER OR (KITS) ×3 IMPLANT
MANIFOLD NEPTUNE II (INSTRUMENTS) IMPLANT
NEEDLE 22X1 1/2 (OR ONLY) (NEEDLE) ×3 IMPLANT
NEEDLE PRECISIONGLIDE 27X1.5 (NEEDLE) IMPLANT
NS IRRIG 1000ML POUR BTL (IV SOLUTION) ×3 IMPLANT
PACK ORTHO EXTREMITY (CUSTOM PROCEDURE TRAY) ×3 IMPLANT
PAD ARMBOARD 7.5X6 YLW CONV (MISCELLANEOUS) ×9 IMPLANT
PAD CAST 3X4 CTTN HI CHSV (CAST SUPPLIES) IMPLANT
PAD CAST 4YDX4 CTTN HI CHSV (CAST SUPPLIES) ×6 IMPLANT
PADDING CAST COTTON 3X4 STRL (CAST SUPPLIES)
PADDING CAST COTTON 4X4 STRL (CAST SUPPLIES) ×12
SLING ARM FOAM STRAP XLG (SOFTGOODS) ×3 IMPLANT
SOAP 2 % CHG 4 OZ (WOUND CARE) ×3 IMPLANT
SPLINT PLASTER CAST XFAST 5X30 (CAST SUPPLIES) ×1 IMPLANT
SPLINT PLASTER XFAST SET 5X30 (CAST SUPPLIES) ×2
STRIP CLOSURE SKIN 1/2X4 (GAUZE/BANDAGES/DRESSINGS) ×2 IMPLANT
SUCTION FRAZIER HANDLE 10FR (MISCELLANEOUS) ×2
SUCTION TUBE FRAZIER 10FR DISP (MISCELLANEOUS) ×1 IMPLANT
SUT 2 FIBERLOOP 20 STRT BLUE (SUTURE) ×3
SUT ETHILON 5 0 PS 2 18 (SUTURE) IMPLANT
SUT MNCRL AB 4-0 PS2 18 (SUTURE) ×3 IMPLANT
SUT PROLENE 3 0 PS 2 (SUTURE) IMPLANT
SUT VIC AB 2-0 CT1 27 (SUTURE) ×2
SUT VIC AB 2-0 CT1 TAPERPNT 27 (SUTURE) ×1 IMPLANT
SUT VIC AB 3-0 FS2 27 (SUTURE) ×3 IMPLANT
SUT VIC AB 3-0 X1 27 (SUTURE) ×3 IMPLANT
SUTURE 2 FIBERLOOP 20 STRT BLU (SUTURE) ×1 IMPLANT
SYR CONTROL 10ML LL (SYRINGE) ×3 IMPLANT
SYSTEM BICEP REPAIR KNTLS DIST (Anchor) ×3 IMPLANT
TOWEL OR 17X24 6PK STRL BLUE (TOWEL DISPOSABLE) IMPLANT
TOWEL OR 17X26 10 PK STRL BLUE (TOWEL DISPOSABLE) IMPLANT
TUBE CONNECTING 12'X1/4 (SUCTIONS) ×1
TUBE CONNECTING 12X1/4 (SUCTIONS) ×2 IMPLANT
UNDERPAD 30X30 (UNDERPADS AND DIAPERS) ×3 IMPLANT
WATER STERILE IRR 1000ML POUR (IV SOLUTION) IMPLANT

## 2017-05-25 NOTE — Transfer of Care (Addendum)
Immediate Anesthesia Transfer of Care Note  Patient: Brandon Parker  Procedure(s) Performed: LEFT DISTAL BICEPS TENDON REPAIR (Left Arm Upper)  Patient Location: PACU  Anesthesia Type:General  Level of Consciousness: drowsy and patient cooperative  Airway & Oxygen Therapy: Patient Spontanous Breathing and Patient connected to face mask oxygen  Post-op Assessment: Report given to RN and Post -op Vital signs reviewed and stable  Post vital signs: Reviewed and stable  Last Vitals:  Vitals:   05/25/17 0727 05/25/17 1022  BP: 137/72 138/85  Pulse: 85 82  Resp: 20 (!) 22  Temp: 36.9 C 36.4 C  SpO2: 98% 95%    Last Pain:  Vitals:   05/25/17 0727  TempSrc: Oral  PainSc: 2       Patients Stated Pain Goal: 9 (25/83/46 2194)  Complications: No apparent anesthesia complications

## 2017-05-25 NOTE — Anesthesia Procedure Notes (Signed)
Procedure Name: Intubation Date/Time: 05/25/2017 8:40 AM Performed by: Freddie Breech, CRNA Pre-anesthesia Checklist: Patient identified, Emergency Drugs available, Suction available and Patient being monitored Patient Re-evaluated:Patient Re-evaluated prior to induction Oxygen Delivery Method: Circle System Utilized Preoxygenation: Pre-oxygenation with 100% oxygen Induction Type: IV induction Ventilation: Mask ventilation without difficulty Laryngoscope Size: Mac and 4 Grade View: Grade II Tube type: Oral Tube size: 8.0 mm Number of attempts: 1 Airway Equipment and Method: Stylet and Oral airway Placement Confirmation: ETT inserted through vocal cords under direct vision,  positive ETCO2 and breath sounds checked- equal and bilateral Secured at: 23 cm Tube secured with: Tape Dental Injury: Teeth and Oropharynx as per pre-operative assessment

## 2017-05-25 NOTE — Discharge Instructions (Signed)
Wear sling for comfort.  Do not remove splint.  Do not get splint wet.  Apply ice and elevate for pain and swelling.  Follow up appointment as scheduled

## 2017-05-25 NOTE — Interval H&P Note (Signed)
History and Physical Interval Note:  05/25/2017 8:03 AM  Brandon Parker  has presented today for surgery, with the diagnosis of LEFT DISTAL BICEPS RUPTURE  The various methods of treatment have been discussed with the patient and family. After consideration of risks, benefits and other options for treatment, the patient has consented to  Procedure(s): LEFT DISTAL BICEPS TENDON REPAIR (Left) as a surgical intervention .  The patient's history has been reviewed, patient examined, no change in status, stable for surgery.  I have reviewed the patient's chart and labs.  Questions were answered to the patient's satisfaction.     Hiram Gash

## 2017-05-25 NOTE — Op Note (Signed)
Orthopaedic Surgery Operative Note (CSN: [id number])  Brandon Parker  11/15/1970 Date of Surgery: 05/25/2017   Diagnoses:  LEFT DISTAL BICEPS RUPTURE  Procedures:   * LEFT DISTAL BICEPS TENDON REPAIR 25852   Operative Finding Successful completion of planned procedure. Two distal insertional heads of the biceps noted.  One was completely ruptured and retracted about 1 cm the other was partially torn and able to be pulled away from the radius primarily with traction.  Elbow ROM to 10 degrees short of full extension without tension on repair.    Post-operative plan: The patient will be NWB in a splint.  The patient will be admitted for observation in setting of untreated sleep apnea for anesthesia monitoring.  DVT prophylaxis not indicated in this isolated upper extremity surgical patient who is ambulatory..  Pain control with PRN pain medication preferring oral medicines.  Follow up plan will be scheduled in approximately 10 days for incisional check, XR of elbow and transition to hinged elbow brace.  OK to start with 30 degree block to extension initially.  Post-Op Diagnosis: Same Surgeons:Primary: Hiram Gash, MD Assistants:Lindsey Venida Jarvis [facility] OR ROOM 07 Anesthesia: General Antibiotics: Ancef 3g Tourniquet time:  Total Tourniquet Time Documented: Upper Arm (Left) - 48 minutes Total: Upper Arm (Left) - 48 minutes  Estimated Blood Loss: <77 mL Complications: None Specimens: None Implants: Implant Name Type Inv. Item Serial No. Manufacturer Lot No. LRB No. Used Action  KNOTLESS DISTAL BICEPS 2.5 X 12 MM    [employer] [id number] Left 1 Implanted    Indications for Surgery:   Brandon Parker is a 46 y.o. male with left distal biceps rupture resulting in pain and weakness in left arm.  Patient has had a significant surgical history on his Right elbow and this has left him with limited use of that hand.  He thus requires full use of his left arm without limitation.  Benefits and  risks of operative and nonoperative management were discussed prior to surgery with patient/guardian(s) and informed consent form was completed.  Specific risks including infection, need for additional surgery, rerupture, fracture and damage to LABCN.   Procedure:   The patient was identified in the preoperative holding area where the surgical site was marked. The patient was taken to the OR where a procedural timeout was called and the above noted anesthesia was induced.  The patient was positioned supine on a regular bed.  Preoperative antibiotics were dosed.  The patient's left arm was prepped and draped in the usual sterile fashion.  A second preoperative timeout was called.      A tourniquet was used for the above listed time.  We made a transverse 4 cm incision about 3 cm distal to the elbow crease and sharply incised the skin achieving hemostasis as we progressed.  We dissected the superficial subcutaneous tissue bluntly with a scissor isolating the mobilizing any superficial veins.  At this point we identified the interval between the pronator and the brachial radialis and bluntly dissected with finger dissection.  The lateral antebrachial cutaneous nerve was noted running along the radial border of the radial border of approach the approach with great care to avoid excess radially based retraction to avoid damage to this nerve.    We then were able to isolate and identify the torn biceps tendon and a seroma as is typical was encountered.  The biceps itself had two distal insertions with one fully torn and retracted and the other 80% torn from it's insertion.  The partially torn and was assessed and not evaluated to be able to be healed without tension.  It was released sharply from its insertion on the radial tuberosity.  At this point the biceps tendon was pulled with traction using an Allis clamp and were able to mobilize it and identified that it had good excursion.  We then proceeded to use  an [employer] knotless tight rope to proceed with the repair.  A fiber tape was placed from proximal to distal and used to secure the tight rope construct about 5 mm proximal to the distal most aspect of the tendon.  We then whipstitched through the fiber tape creating a strong interdigitated construct that allowed the tight rope to be fixed to the distal end of the tendon.  This was tested vigorously to note that there was no slippage or elongation of the construct.  At this point we proceeded to place blunt retractors around the radial tuberosity and maximally supinate the arm to bring the tuberosity into view.  Was cleared of soft tissue and we were able to place a 3.7 mm guidewire under fluoroscopic guidance into the tuberosity from ulnar to radial bicortically.  Once were happy with our position and after checking with fluoroscopic guidance we then reamed a 7 mm tunnel which was the size of the distal tendon.  The tunnel had good walls there is no sign of breech.  The incision was copiously irrigated to avoid bone that might lead to heterotopic ossification.  This point the inserter was used the tight rope device was used to sequentially pull the biceps tendon into its tunnel and we noted approximately 1-1/2 cm tendon that was contained within the tunnel.  Elbow was cycled and the tendon was pulled to note if there is any elongation or slack in the system.  Tendon was well fixed.  The tourniquet was then released we checked for any superficial bleeders were deep bleeders and there were none.  The incision was again irrigated and the lateral antebrachial cutaneous nerve was noted to be intact at the end of the case.  We then closed the incision with absorbable sutures in a multilayer fashion placed Steri-Strips and a sterile dressing prior to a splint at 90 degrees.  Local anesthetic was injected around the incision prior to dressing placement.  Patient was awoken from anesthesia and taken to the recovery  room in stable condition.  Tawanna Cooler PA-C, present and scrubbed throughout the case, critical for completion in a timely fashion, and for retraction, instrumentation, closure.

## 2017-05-25 NOTE — Anesthesia Preprocedure Evaluation (Addendum)
Anesthesia Evaluation  Patient identified by MRN, date of birth, ID band Patient awake    Reviewed: Allergy & Precautions, NPO status , Patient's Chart, lab work & pertinent test results  History of Anesthesia Complications Negative for: history of anesthetic complications  Airway Mallampati: II  TM Distance: >3 FB Neck ROM: Full    Dental  (+) Missing, Dental Advisory Given, Partial Upper,    Pulmonary sleep apnea ,  Sleep apnea severe    + decreased breath sounds      Cardiovascular negative cardio ROS   Rhythm:Regular Rate:Normal  Recent echo noted   Neuro/Psych negative neurological ROS     GI/Hepatic hiatal hernia,   Endo/Other  Morbid obesityMassively obese  Renal/GU negative Renal ROS     Musculoskeletal   Abdominal (+) + obese,   Peds  Hematology   Anesthesia Other Findings   Reproductive/Obstetrics                            Anesthesia Physical Anesthesia Plan  ASA: IV  Anesthesia Plan:    Post-op Pain Management:    Induction: Intravenous  PONV Risk Score and Plan: 2 and Dexamethasone, Ondansetron and Treatment may vary due to age or medical condition  Airway Management Planned: Video Laryngoscope Planned and Oral ETT  Additional Equipment:   Intra-op Plan:   Post-operative Plan: Possible Post-op intubation/ventilation  Informed Consent:   Plan Discussed with:   Anesthesia Plan Comments:         Anesthesia Quick Evaluation

## 2017-05-25 NOTE — Anesthesia Postprocedure Evaluation (Signed)
Anesthesia Post Note  Patient: Custer Pimenta  Procedure(s) Performed: LEFT DISTAL BICEPS TENDON REPAIR (Left Arm Upper)     Patient location during evaluation: PACU Anesthesia Type: General Level of consciousness: awake and alert Pain management: pain level controlled Vital Signs Assessment: post-procedure vital signs reviewed and stable Respiratory status: spontaneous breathing, nonlabored ventilation, respiratory function stable and patient connected to nasal cannula oxygen Cardiovascular status: blood pressure returned to baseline and stable Postop Assessment: no apparent nausea or vomiting Anesthetic complications: no    Last Vitals:  Vitals:   05/25/17 1103 05/25/17 1122  BP: (!) 160/81 (!) 144/75  Pulse: 81 74  Resp: 20 20  Temp: (!) 36.4 C 36.6 C  SpO2: 95% 93%    Last Pain:  Vitals:   05/25/17 1122  TempSrc: Oral  PainSc:                  Yulitza Shorts,JAMES TERRILL

## 2017-05-26 ENCOUNTER — Other Ambulatory Visit: Payer: Self-pay | Admitting: *Deleted

## 2017-05-26 ENCOUNTER — Encounter (HOSPITAL_COMMUNITY): Payer: Self-pay | Admitting: Orthopaedic Surgery

## 2017-05-26 DIAGNOSIS — R29818 Other symptoms and signs involving the nervous system: Secondary | ICD-10-CM

## 2017-05-26 DIAGNOSIS — S46212A Strain of muscle, fascia and tendon of other parts of biceps, left arm, initial encounter: Secondary | ICD-10-CM | POA: Diagnosis not present

## 2017-05-26 NOTE — Discharge Summary (Signed)
Physician Discharge Summary  Patient ID: Brandon Parker MRN: [id number] DOB/AGE: 1971/06/18 46 y.o.  Admit date: 05/25/2017 Discharge date: 05/26/2017  Admission Diagnoses:  Discharge Diagnoses:  Active Problems:   Rupture of left distal biceps tendon   Discharged Condition: good  Hospital Course: Patient brought in as an outpatient for surgery.  Tolerated procedure well.  Was kept for monitoring overnight for pain control and medical monitoring postop and was found to be stable for DC home the morning after surgery.  Patient was instructed on specific activity restrictions and all questions were answered.   Consults: None  Significant Diagnostic Studies: none  Treatments: Left distal biceps repair  Discharge Exam: Blood pressure 137/76, pulse 70, temperature 98.5 F (36.9 C), temperature source Oral, resp. rate 19, height 6' (1.829 m), weight (!) 195 kg (430 lb), SpO2 99 %. General appearance: no distress  LUE: Splint CDI. Skin intact though cannot assess fully beneath splint. Nontender to palpation proximally, with full and painless ROM throughout hand with DPC of 0. + Motor in  AIN, PIN, Ulnar distributions. Sensation intact in medial, radial, and ulnar distributions. Well perfused digits.    Disposition: Final discharge disposition not confirmed  Discharge Instructions    Call MD for:  persistant nausea and vomiting   Complete by:  As directed    Call MD for:  redness, tenderness, or signs of infection (pain, swelling, redness, odor or green/yellow discharge around incision site)   Complete by:  As directed    Call MD for:  severe uncontrolled pain   Complete by:  As directed    Diet - low sodium heart healthy   Complete by:  As directed    Discharge instructions   Complete by:  As directed    Ophelia Charter MD, MPH [place]. [address] [phone] (tel)   [phone] (fax)   POST-OPERATIVE INSTRUCTIONS   WOUND CARE Please  keep splint clean dry and intact until followup.  You may shower on Post-Op Day #2. You must keep splint dry during this process and may find that a plastic bag taped around the leg or alternatively a towel based bath may be a better option.  If you get your splint wet or if it is damaged please contact our clinic.  EXERCISES Due to your splint being in place you will not be able to bear weight through your extremity.   Please use your sling until follow-up. Please continue to work on range of motion of your fingers and stretch these multiple times a day to prevent stiffness.  POST-OP MEDICINES A multi-modal approach will be used to treat your pain. Oxycodone - This is a strong narcotic, to be used only on an "as needed" basis for pain. Acetaminophen 500mg - A non-narcotic pain medicine.  Use 1000mg  three times a day for the first 14 days after surgery   Zofran 4mg  - This is an anti-nausea medicine to be used only if you are having nausea or vomitting. If you have any adverse effects with the medications, please call our office.  FOLLOW-UP If you develop a Fever (>101.5), Redness or Drainage from the surgical incision site, please call our office to arrange for an evaluation. Please call the office to schedule a follow-up appointment if you do not have one.   Increase activity slowly   Complete by:  As directed      Allergies as of 05/26/2017   No Known Allergies     Medication List  STOP taking these medications   ibuprofen 200 MG tablet Commonly known as:  ADVIL,MOTRIN     TAKE these medications   acetaminophen 500 MG tablet Commonly known as:  TYLENOL Take 2 tabs po q8 hours x 14 days following surgery   albuterol 108 (90 Base) MCG/ACT inhaler Commonly known as:  PROVENTIL HFA;VENTOLIN HFA Inhale 2 puffs into the lungs every 6 (six) hours as needed for wheezing.   colchicine 0.6 MG tablet Take 1 tablet (0.6 mg total) by mouth daily as needed. (may take 2 on first  day) What changed:    reasons to take this  additional instructions   meloxicam 7.5 MG tablet Commonly known as:  MOBIC Take 1 tablet (7.5 mg total) daily for 14 doses by mouth.   ondansetron 4 MG tablet Commonly known as:  ZOFRAN Take 1 tablet (4 mg total) every 8 (eight) hours as needed by mouth for nausea or vomiting.   oxyCODONE 5 MG immediate release tablet Commonly known as:  ROXICODONE Take 1-2 tabs po q6-8 hours prn pain.  Do not exceed 6 tabs per day      Follow-up Information    Hiram Gash, MD.   Specialty:  Orthopedic Surgery Why:  as already scheduled Contact information: [address] [place] [address] [phone]           Signed: Hiram Gash 05/26/2017, 7:15 AM

## 2017-08-12 ENCOUNTER — Encounter: Payer: Self-pay | Admitting: Internal Medicine

## 2017-08-12 ENCOUNTER — Ambulatory Visit: Payer: Commercial Managed Care - PPO | Admitting: Internal Medicine

## 2017-08-12 ENCOUNTER — Ambulatory Visit (INDEPENDENT_AMBULATORY_CARE_PROVIDER_SITE_OTHER)
Admission: RE | Admit: 2017-08-12 | Discharge: 2017-08-12 | Disposition: A | Discharge status: Home / Self Care | Payer: Commercial Managed Care - PPO | Source: Ambulatory Visit | Attending: Internal Medicine | Admitting: Internal Medicine

## 2017-08-12 VITALS — BP 124/90 | HR 77 | Temp 98.0°F | Resp 18 | Wt >= 6400 oz

## 2017-08-12 DIAGNOSIS — J45901 Unspecified asthma with (acute) exacerbation: Secondary | ICD-10-CM

## 2017-08-12 MED ORDER — AMOXICILLIN 500 MG PO TABS: 1000.0000 mg | ORAL_TABLET | Freq: Two times a day (BID) | ORAL | 0 refills | Status: DC

## 2017-08-12 MED ORDER — PREDNISONE 20 MG PO TABS: 40.0000 mg | ORAL_TABLET | Freq: Every day | ORAL | 0 refills | Status: DC

## 2017-08-12 NOTE — Assessment & Plan Note (Addendum)
Wheezing with preceding sinus infection Some pleuritic chest pain CXR--looks okay  Will treat with amoxil Prednisone again Has tessalon--- these help some

## 2017-08-12 NOTE — Progress Notes (Signed)
Subjective:    Patient ID: Brandon Parker, male    DOB: 22-Aug-1970, 47 y.o.   MRN: [id number]  HPI Here due to respiratory infection  Started a week before Christmas---headache and sinus Went on app from his insurance Got Rx for steroid and took zyrtec D Felt better after the steroids but then worsened again  10 days ago--pain in right shoulder--and then down to upper back (this is pleuritic) Swollen lymph glands in neck No tired and fatigued Ongoing sinus congestion and drainage Gets sense of SOB only from the drainage on his palate Now feels it in his chest Productive cough No fever, chills or sweats  Has albuterol inhaler--not much help  Current Outpatient Medications on File Prior to Visit  Medication Sig Dispense Refill  . acetaminophen (TYLENOL) 500 MG tablet Take 2 tabs po q8 hours x 14 days following surgery 84 tablet 2  . albuterol (PROVENTIL HFA;VENTOLIN HFA) 108 (90 Base) MCG/ACT inhaler Inhale 2 puffs into the lungs every 6 (six) hours as needed for wheezing. 1 Inhaler 1  . colchicine 0.6 MG tablet Take 1 tablet (0.6 mg total) by mouth daily as needed. (may take 2 on first day) (Patient taking differently: Take 0.6 mg by mouth daily as needed (GOUT). (may take 2 on first day)) 30 tablet 0   No current facility-administered medications on file prior to visit.     No Known Allergies  Past Medical History:  Diagnosis Date  . CAP (community acquired pneumonia) 07/08/2014  . Esophagitis 1992  . Fracture, humerus, open 2011  . Gout   . History of chicken pox   . History of hiatal hernia    s/p  . Microhematuria 03/2016   s/p unrevealing uro eval (CT, cystoscopy) by Dr Felipa Eth. 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 Morbid obesity ([place])   . Pre-diabetes   . Pulmonary nodules 2011   per report CT 06/21/10 at [facility]: tiny, likely benign in non-smoker, no f/u needed  . Secondary male hypogonadism 2015   nl SHBG, low free and tot T, nl LH/FSH  . Skull fracture  ([place]) 2011  . Sleep apnea    home study 05/19/17 does not results yet    Past Surgical History:  Procedure Laterality Date  . BICEPS TENDON REPAIR Left 110/01/2017   Dr Griffin Basil  . DISTAL BICEPS TENDON REPAIR Left 05/25/2017   Procedure: LEFT DISTAL BICEPS TENDON REPAIR;  Surgeon: Hiram Gash, MD;  Location: [place];  Service: Orthopedics;  Laterality: Left;  . HIATAL HERNIA REPAIR  1990   esophagitis (nissen?)  . ORIF HUMERUS FRACTURE Right 2011   hardware, incomplete healing, incomplete elbow motion  . VASECTOMY  1999    Family History  Problem Relation Age of Onset  . Diabetes Mother   . Clotting disorder Mother        ITP  . Cancer Father 108       thyroid  . Cancer Paternal Aunt 37       lung, smoker  . Cancer Paternal Uncle 45       lung, smoker  . Coronary artery disease Maternal Grandmother 60  . Stroke Maternal Grandmother   . Cancer Maternal Grandfather 50       colon  . Asthma Daughter   . Asthma Son     Social History   Socioeconomic History  . Marital status: Single    Spouse name: Not on file  . Number of children: Not on file  .  Years of education: Not on file  . Highest education level: Not on file  Social Needs  . Financial resource strain: Not on file  . Food insecurity - worry: Not on file  . Food insecurity - inability: Not on file  . Transportation needs - medical: Not on file  . Transportation needs - non-medical: Not on file  Occupational History  . Not on file  Tobacco Use  . Smoking status: Never Smoker  . Smokeless tobacco: Never Used  Substance and Sexual Activity  . Alcohol use: Yes    Alcohol/week: 0.0 oz    Comment: Occasional  . Drug use: No  . Sexual activity: Yes    Partners: Female  Other Topics Concern  . Not on file  Social History Narrative   Caffeine: monster energy 2-3/day   Lives with mother, daughter, son   Stays with GF.   Occupation: Doctor, hospital   Activity: occasional exercise bike   Diet: good  water, seldom fruits/vegetables, good protein, 1600 cal/day diet, daily red meat, rare fish   Review of Systems No rash No vomiting or diarrhea Appetite is okay    Objective:   Physical Exam  Constitutional: He appears well-developed. No distress.  HENT:  Mild pharyngeal injection without exudates Moderate nasal inflammation No sinus tenderness TMs normal  Neck: No thyromegaly present.  Pulmonary/Chest: Effort normal.  Fair air movement but loud expiratory wheezing and mild exp phase prolongation No crackles or clear cut dullness  Lymphadenopathy:    He has no cervical adenopathy.          Assessment & Plan:

## 2017-08-16 ENCOUNTER — Encounter: Payer: Self-pay | Admitting: Internal Medicine

## 2017-08-16 ENCOUNTER — Ambulatory Visit: Payer: Commercial Managed Care - PPO | Admitting: Internal Medicine

## 2017-08-16 VITALS — BP 120/82 | HR 69 | Temp 98.2°F | Wt >= 6400 oz

## 2017-08-16 DIAGNOSIS — J0110 Acute frontal sinusitis, unspecified: Secondary | ICD-10-CM | POA: Insufficient documentation

## 2017-08-16 MED ORDER — AMOXICILLIN-POT CLAVULANATE 875-125 MG PO TABS: 1.0000 | ORAL_TABLET | Freq: Two times a day (BID) | ORAL | 0 refills | Status: AC

## 2017-08-16 NOTE — Progress Notes (Signed)
Subjective:    Patient ID: Brandon Parker, male    DOB: Aug 26, 1970, 47 y.o.   MRN: [id number]  HPI Here due to persistent respiratory difficulty  Somewhat better for 2 days after the last visit Then increased sinus pressure and headache 2 days ago Trouble sleeping Terrible drainage and seeping out of tear ducts (to right eye)  Constant blowing of nose Frontal pressure is bad No fever  Not tight in chest and not as SOB Some wheezing --but better than before  Current Outpatient Medications on File Prior to Visit  Medication Sig Dispense Refill  . acetaminophen (TYLENOL) 500 MG tablet Take 2 tabs po q8 hours x 14 days following surgery 84 tablet 2  . albuterol (PROVENTIL HFA;VENTOLIN HFA) 108 (90 Base) MCG/ACT inhaler Inhale 2 puffs into the lungs every 6 (six) hours as needed for wheezing. 1 Inhaler 1  . amoxicillin (AMOXIL) 500 MG tablet Take 2 tablets (1,000 mg total) by mouth 2 (two) times daily for 7 days. 28 tablet 0  . colchicine 0.6 MG tablet Take 1 tablet (0.6 mg total) by mouth daily as needed. (may take 2 on first day) (Patient taking differently: Take 0.6 mg by mouth daily as needed (GOUT). (may take 2 on first day)) 30 tablet 0  . predniSONE (DELTASONE) 20 MG tablet Take 2 tablets (40 mg total) by mouth daily. For 5 days, then 1 tab daily for 5 days 15 tablet 0   No current facility-administered medications on file prior to visit.     No Known Allergies  Past Medical History:  Diagnosis Date  . CAP (community acquired pneumonia) 07/08/2014  . Esophagitis 1992  . Fracture, humerus, open 2011  . Gout   . History of chicken pox   . History of hiatal hernia    s/p  . Microhematuria 03/2016   s/p unrevealing uro eval (CT, cystoscopy) by Dr Felipa Eth. 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 Morbid obesity ([place])   . Pre-diabetes   . Pulmonary nodules 2011   per report CT [facility]/10 [facility]: tiny, likely benign in non-smoker, no f/u needed  . Secondary male hypogonadism  2015   nl SHBG, low free and tot T, nl LH/FSH  . Skull fracture ([place]) 2011  . Sleep apnea    home study 05/19/17 does not results yet    Past Surgical History:  Procedure Laterality Date  . BICEPS TENDON REPAIR Left 110/01/2017   Dr Griffin Basil  . DISTAL BICEPS TENDON REPAIR Left 05/25/2017   Procedure: LEFT DISTAL BICEPS TENDON REPAIR;  Surgeon: Hiram Gash, MD;  Location: [place];  Service: Orthopedics;  Laterality: Left;  . HIATAL HERNIA REPAIR  1990   esophagitis (nissen?)  . ORIF HUMERUS FRACTURE Right 2011   hardware, incomplete healing, incomplete elbow motion  . VASECTOMY  1999    Family History  Problem Relation Age of Onset  . Diabetes Mother   . Clotting disorder Mother        ITP  . Cancer Father 63       thyroid  . Cancer Paternal Aunt 2       lung, smoker  . Cancer Paternal Uncle 1       lung, smoker  . Coronary artery disease Maternal Grandmother 60  . Stroke Maternal Grandmother   . Cancer Maternal Grandfather 50       colon  . Asthma Daughter   . Asthma Son     Social History   Socioeconomic History  .  Marital status: Single    Spouse name: Not on file  . Number of children: Not on file  . Years of education: Not on file  . Highest education level: Not on file  Social Needs  . Financial resource strain: Not on file  . Food insecurity - worry: Not on file  . Food insecurity - inability: Not on file  . Transportation needs - medical: Not on file  . Transportation needs - non-medical: Not on file  Occupational History  . Not on file  Tobacco Use  . Smoking status: Never Smoker  . Smokeless tobacco: Never Used  Substance and Sexual Activity  . Alcohol use: Yes    Alcohol/week: 0.0 oz    Comment: Occasional  . Drug use: No  . Sexual activity: Yes    Partners: Female  Other Topics Concern  . Not on file  Social History Narrative   Caffeine: monster energy 2-3/day   Lives with mother, daughter, son   Stays with GF.   Occupation: Chartered loss adjuster   Activity: occasional exercise bike   Diet: good water, seldom fruits/vegetables, good protein, 1600 cal/day diet, daily red meat, rare fish   Review of Systems No diarrhea Appetite is okay No vomiting    Objective:   Physical Exam  Constitutional: No distress.  HENT:  Mouth/Throat: Oropharynx is clear and moist. No oropharyngeal exudate.  TMs normal No sinus tenderness but his pressure is frontal Moderate nasal congestion  Neck: No thyromegaly present.  Pulmonary/Chest: Effort normal and breath sounds normal. No respiratory distress. He has no wheezes. He has no rales.  Lymphadenopathy:    He has no cervical adenopathy.          Assessment & Plan:

## 2017-08-16 NOTE — Assessment & Plan Note (Signed)
RAD symptoms are better but sinus is worse despite the amoxil Will try augmentin (avoid levaquin due to healing tendon post op) Finishing the prednisone

## 2017-08-25 ENCOUNTER — Encounter: Payer: Self-pay | Admitting: Internal Medicine

## 2017-11-11 ENCOUNTER — Encounter: Payer: Self-pay | Admitting: Family Medicine

## 2017-11-11 ENCOUNTER — Ambulatory Visit: Payer: Commercial Managed Care - PPO | Admitting: Family Medicine

## 2017-11-11 VITALS — BP 126/82 | HR 75 | Temp 98.3°F | Ht 72.0 in | Wt >= 6400 oz

## 2017-11-11 DIAGNOSIS — J45901 Unspecified asthma with (acute) exacerbation: Secondary | ICD-10-CM | POA: Diagnosis not present

## 2017-11-11 DIAGNOSIS — J22 Unspecified acute lower respiratory infection: Secondary | ICD-10-CM | POA: Insufficient documentation

## 2017-11-11 MED ORDER — PREDNISONE 20 MG PO TABS: ORAL_TABLET | ORAL | 0 refills | Status: DC

## 2017-11-11 NOTE — Patient Instructions (Signed)
I think you have reactive airways - either flared from viral infection or allergies.  Continue zyrtec D. Take prednisone taper sent to pharmacy.  Schedule albuterol inhaler 2 puffs three times daily for next 3 days.  Update [place] if fever >101, worsening cough or not improving with treatment.

## 2017-11-11 NOTE — Progress Notes (Signed)
BP 126/82 (BP Location: Left Arm, Patient Position: Sitting, Cuff Size: Large)   Pulse 75   Temp 98.3 F (36.8 C) (Oral)   Ht 6' (1.829 m)   Wt (!) 444 lb 8 oz (201.6 kg)   SpO2 94%   BMI 60.29 kg/m    CC: cough Subjective:    Patient ID: Brandon Parker, male    DOB: [birth year], 47 y.o.   MRN: [id number]  HPI: Brandon Parker is a 47 y.o. male presenting on 11/11/2017 for Cough (Productive cough for about 1 wk. Has some hoarseness. Has coughing "episodes" in the car after work. Also, happens when lying down in bed. Tried Zyrtec-D, not helpful.  Has this issue at least once a year. )   1 wk h/o cough, congestion, hoarseness and sore throat for a few days. Mild wheezing - using albuterol inhaler with some benefit. Symptoms seem to be worse on drive home to work after work, or when laying down at bedtime - fits of productive cough, congestion, dyspnea.   No fevers/chills, ear or tooth pain.   Treating with zyrtec D. Also using albuterol inhaler.  No sick contacts Known RAD.  Non smoker.   Relevant past medical, surgical, family and social history reviewed and updated as indicated. Interim medical history since our last visit reviewed. Allergies and medications reviewed and updated. Outpatient Medications Prior to Visit  Medication Sig Dispense Refill  . albuterol (PROVENTIL HFA;VENTOLIN HFA) 108 (90 Base) MCG/ACT inhaler Inhale 2 puffs into the lungs every 6 (six) hours as needed for wheezing. 1 Inhaler 1  . colchicine 0.6 MG tablet Take 1 tablet (0.6 mg total) by mouth daily as needed. (may take 2 on first day) (Patient taking differently: Take 0.6 mg by mouth daily as needed (GOUT). (may take 2 on first day)) 30 tablet 0  . acetaminophen (TYLENOL) 500 MG tablet Take 2 tabs po q8 hours x 14 days following surgery 84 tablet 2  . predniSONE (DELTASONE) 20 MG tablet Take 2 tablets (40 mg total) by mouth daily. For 5 days, then 1 tab daily for 5 days 15 tablet 0   No facility-administered  medications prior to visit.      Per HPI unless specifically indicated in ROS section below Review of Systems     Objective:    BP 126/82 (BP Location: Left Arm, Patient Position: Sitting, Cuff Size: Large)   Pulse 75   Temp 98.3 F (36.8 C) (Oral)   Ht 6' (1.829 m)   Wt (!) 444 lb 8 oz (201.6 kg)   SpO2 94%   BMI 60.29 kg/m   Wt Readings from Last 3 Encounters:  11/11/17 (!) 444 lb 8 oz (201.6 kg)  08/16/17 (!) 441 lb 8 oz (200.3 kg)  08/12/17 (!) 433 lb (196.4 kg)    Physical Exam  Constitutional: He appears well-developed and well-nourished. No distress.  HENT:  Head: Normocephalic and atraumatic.  Right Ear: Hearing, tympanic membrane, external ear and ear canal normal.  Left Ear: Hearing, tympanic membrane, external ear and ear canal normal.  Nose: Nose normal. No mucosal edema or rhinorrhea. Right sinus exhibits no maxillary sinus tenderness and no frontal sinus tenderness. Left sinus exhibits no maxillary sinus tenderness and no frontal sinus tenderness.  Mouth/Throat: Uvula is midline and mucous membranes are normal. Posterior oropharyngeal edema present. No oropharyngeal exudate, posterior oropharyngeal erythema or tonsillar abscesses.  Eyes: Pupils are equal, round, and reactive to light. Conjunctivae and EOM are normal. No scleral icterus.  Neck: Normal range of motion. Neck supple.  Cardiovascular: Normal rate, regular rhythm, normal heart sounds and intact distal pulses.  No murmur heard. Pulmonary/Chest: Effort normal. No respiratory distress. He has wheezes (diffuse polysymphonic worse with expiration). He has no rales.  Prolonged expiratory phase  Lymphadenopathy:    He has no cervical adenopathy.  Skin: Skin is warm and dry. No rash noted.  Nursing note and vitals reviewed.     Assessment & Plan:   Problem List Items Addressed This Visit    Acute respiratory infection    Anticipate viral given short duration. Triggering RAD exacerbation.       Exacerbation of RAD (reactive airway disease) - Primary    Viral vs allergic exacerbation.  No signs of bacterial infection Rx prednisone 60mg  taper, rec schedule albuterol inhaler 2 puffs TID for 3 days then back off to PRN.  Red flags to seek care over weekend reviewed.  Update if not improving with treatment.  Will recommend spirometry when feeling better.           Meds ordered this encounter  Medications  . predniSONE (DELTASONE) 20 MG tablet    Sig: Take 3 tablets for 3 days then 2 tablets for 3 days then 1 tablet for 3 days with breakfast    Dispense:  18 tablet    Refill:  0   No orders of the defined types were placed in this encounter.   Follow up plan: Return if symptoms worsen or fail to improve.  Ria Bush, MD

## 2017-11-11 NOTE — Assessment & Plan Note (Signed)
Anticipate viral given short duration. Triggering RAD exacerbation.

## 2017-11-11 NOTE — Assessment & Plan Note (Addendum)
Viral vs allergic exacerbation.  No signs of bacterial infection Rx prednisone 60mg  taper, rec schedule albuterol inhaler 2 puffs TID for 3 days then back off to PRN.  Red flags to seek care over weekend reviewed.  Update if not improving with treatment.  Will recommend spirometry when feeling better.

## 2018-01-09 ENCOUNTER — Other Ambulatory Visit: Payer: Self-pay | Admitting: Internal Medicine

## 2018-01-10 NOTE — Telephone Encounter (Signed)
Last filled 12/2016... Please advise as I do not see a diagnosis to support refill

## 2018-02-02 ENCOUNTER — Ambulatory Visit: Payer: Commercial Managed Care - PPO | Admitting: Family Medicine

## 2018-02-02 ENCOUNTER — Encounter: Payer: Self-pay | Admitting: Family Medicine

## 2018-02-02 VITALS — BP 118/80 | HR 75 | Temp 98.3°F | Resp 24 | Ht 72.0 in | Wt >= 6400 oz

## 2018-02-02 DIAGNOSIS — J209 Acute bronchitis, unspecified: Secondary | ICD-10-CM

## 2018-02-02 MED ORDER — AMOXICILLIN-POT CLAVULANATE 875-125 MG PO TABS: 1.0000 | ORAL_TABLET | Freq: Two times a day (BID) | ORAL | 0 refills | Status: DC

## 2018-02-02 MED ORDER — PREDNISONE 10 MG PO TABS: ORAL_TABLET | ORAL | 0 refills | Status: DC

## 2018-02-02 NOTE — Progress Notes (Signed)
Subjective:    Patient ID: Brandon Parker, male    DOB: 1970-10-27, 47 y.o.   MRN: [id number]  HPI 47 yo pt of Dr Darnell Level here with cough   Started 4 weeks ago -just not getting better  Using albuterol inhaler as needed Some nasal congestion  Wheezing-worse at night , can be intermittent  Inhaler helps some -helps him cough   Coughing fit - tend to last 10 min or so  Phlegm is white to tan -occ clear  Wheezes on insp and exp   Nasal d/c- thick and white as well/ can turn to clear    He takes zyrtec -for at least a month   No fever (but has been hot and cold)  No sinus pain  No ear pain   No other meds otc   Hx of reactive airways that get worse with illness Tends go get infections a few times per year  They tend to start very suddenly -wonders if allergies/ often happen after going outside  Never formally tested for asthma  Was tested by pulmonary - and no asthma  He was told he has a large palate that creates upper airway sounds / worse with obesity  Has never had an allergy test  ? If he has allergies  Wonders if his house triggers him (? Mold in old fireplaces)    Patient Active Problem List   Diagnosis Date Noted  . Acute bronchitis with bronchospasm 02/02/2018  . Rupture of left distal biceps tendon 05/25/2017  . OSA (obstructive sleep apnea) 05/19/2017  . Left elbow pain 05/09/2017  . Exertional dyspnea 04/14/2017  . Prediabetes 04/14/2017  . Microhematuria 10/26/2016  . Peripheral edema 10/26/2016  . Exacerbation of RAD (reactive airway disease) 07/23/2015  . Skin lesions 02/05/2015  . Conjunctivitis 09/17/2014  . Secondary male hypogonadism   . Skin rash 11/29/2013  . Nasal congestion 11/29/2013  . Abnormal weight gain 09/10/2013  . Gout   . Preoperative cardiovascular examination 08/05/2011  . 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 Pulmonary nodules 08/05/2011  . Morbid obesity with BMI of 50.0-59.9, adult ([place]) 08/05/2011   Past Medical History:    Diagnosis Date  . CAP (community acquired pneumonia) 07/08/2014  . Esophagitis 1992  . Fracture, humerus, open 2011  . Gout   . History of chicken pox   . History of hiatal hernia    s/p  . Microhematuria 03/2016   s/p unrevealing uro eval (CT, cystoscopy) by Dr Felipa Eth. 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 Morbid obesity ([place])   . Pre-diabetes   . Pulmonary nodules 2011   per report CT [facility]/10 [facility]: tiny, likely benign in non-smoker, no f/u needed  . Secondary male hypogonadism 2015   nl SHBG, low free and tot T, nl LH/FSH  . Skull fracture ([place]) 2011  . Sleep apnea    home study 05/19/17 does not results yet   Past Surgical History:  Procedure Laterality Date  . BICEPS TENDON REPAIR Left 110/01/2017   Dr Griffin Basil  . DISTAL BICEPS TENDON REPAIR Left 05/25/2017   Procedure: LEFT DISTAL BICEPS TENDON REPAIR;  Surgeon: Hiram Gash, MD;  Location: [place];  Service: Orthopedics;  Laterality: Left;  . HIATAL HERNIA REPAIR  1990   esophagitis (nissen?)  . ORIF HUMERUS FRACTURE Right 2011   hardware, incomplete healing, incomplete elbow motion  . VASECTOMY  1999   Social History   Tobacco Use  . Smoking status: Never Smoker  . Smokeless  tobacco: Never Used  Substance Use Topics  . Alcohol use: Yes    Alcohol/week: 0.0 oz    Comment: Occasional  . Drug use: No   Family History  Problem Relation Age of Onset  . Diabetes Mother   . Clotting disorder Mother        ITP  . Cancer Father 3       thyroid  . Cancer Paternal Aunt 48       lung, smoker  . Cancer Paternal Uncle 5       lung, smoker  . Coronary artery disease Maternal Grandmother 60  . Stroke Maternal Grandmother   . Cancer Maternal Grandfather 50       colon  . Asthma Daughter   . Asthma Son    No Known Allergies Current Outpatient Medications on File Prior to Visit  Medication Sig Dispense Refill  . albuterol (PROVENTIL HFA;VENTOLIN HFA) 108 (90 Base) MCG/ACT inhaler INHALE 2 PUFFS INTO THE  LUNGS EVERY 6 HOURS AS NEEDED FOR WHEEZING 54 g 0  . cetirizine (ZYRTEC) 10 MG tablet Take 10 mg by mouth daily.    . colchicine 0.6 MG tablet Take 1 tablet (0.6 mg total) by mouth daily as needed. (may take 2 on first day) (Patient taking differently: Take 0.6 mg by mouth daily as needed (GOUT). (may take 2 on first day)) 30 tablet 0   No current facility-administered medications on file prior to visit.     Review of Systems  Constitutional: Positive for appetite change and fatigue. Negative for fever.  HENT: Positive for congestion, postnasal drip, rhinorrhea, sinus pressure, sneezing and sore throat. Negative for ear pain.   Eyes: Negative for pain and discharge.  Respiratory: Positive for cough and wheezing. Negative for shortness of breath and stridor.   Cardiovascular: Negative for chest pain.  Gastrointestinal: Negative for diarrhea, nausea and vomiting.  Genitourinary: Negative for frequency, hematuria and urgency.  Musculoskeletal: Negative for arthralgias and myalgias.  Skin: Negative for rash.  Neurological: Positive for headaches. Negative for dizziness, weakness and light-headedness.  Psychiatric/Behavioral: Negative for confusion and dysphoric mood.       Objective:   Physical Exam  Constitutional: He appears well-developed and well-nourished. No distress.  Morbidly obese and well appearing   HENT:  Head: Normocephalic and atraumatic.  Right Ear: External ear normal.  Left Ear: External ear normal.  Mouth/Throat: Oropharynx is clear and moist.  Nares are injected and congested  No sinus tenderness Clear rhinorrhea and post nasal drip   Eyes: Pupils are equal, round, and reactive to light. Conjunctivae and EOM are normal. Right eye exhibits no discharge. Left eye exhibits no discharge.  Neck: Normal range of motion. Neck supple.  Cardiovascular: Normal rate and normal heart sounds.  Pulmonary/Chest: Effort normal. No stridor. No respiratory distress. He has wheezes.  He has no rales. He exhibits no tenderness.  Diffuse expiratory wheezing Also upper airway sounds  No rales or rhonchi  Exp phase is not prolonged   Lymphadenopathy:    He has no cervical adenopathy.  Neurological: He is alert.  Skin: Skin is warm and dry. No rash noted.  Psychiatric: He has a normal mood and affect.          Assessment & Plan:   Problem List Items Addressed This Visit      Respiratory   Acute bronchitis with bronchospasm - Primary    In pt with hx of reactive airways with illnesses Px augmentin in light of length of  illness Prednisone taper Albuterol MDI as needed  Disc symptomatic care - see instructions on AVS  Update if not starting to improve in a week or if worsening    Meds ordered this encounter  Medications  . amoxicillin-clavulanate (AUGMENTIN) 875-125 MG tablet    Sig: Take 1 tablet by mouth 2 (two) times daily.    Dispense:  14 tablet    Refill:  0  . predniSONE (DELTASONE) 10 MG tablet    Sig: Take 4 pills once daily by mouth for 3 days then 3 pills once daily for 3 days then 2 pills once daily for 3 days then 1 pill daily for 3 days then stop    Dispense:  30 tablet    Refill:  0

## 2018-02-02 NOTE — Patient Instructions (Signed)
Use albuterol inhaler as needed  Drink lots of fluids Be careful in the heat - it can make wheezing worse   Take augmentin as directed  Prednisone taper as directed   Update if not starting to improve in a week or if worsening

## 2018-02-04 NOTE — Assessment & Plan Note (Signed)
In pt with hx of reactive airways with illnesses Px augmentin in light of length of illness Prednisone taper Albuterol MDI as needed  Disc symptomatic care - see instructions on AVS  Update if not starting to improve in a week or if worsening    Meds ordered this encounter  Medications  . amoxicillin-clavulanate (AUGMENTIN) 875-125 MG tablet    Sig: Take 1 tablet by mouth 2 (two) times daily.    Dispense:  14 tablet    Refill:  0  . predniSONE (DELTASONE) 10 MG tablet    Sig: Take 4 pills once daily by mouth for 3 days then 3 pills once daily for 3 days then 2 pills once daily for 3 days then 1 pill daily for 3 days then stop    Dispense:  30 tablet    Refill:  0

## 2018-03-09 ENCOUNTER — Encounter: Payer: Self-pay | Admitting: Internal Medicine

## 2018-03-09 ENCOUNTER — Ambulatory Visit (INDEPENDENT_AMBULATORY_CARE_PROVIDER_SITE_OTHER): Payer: Commercial Managed Care - PPO | Admitting: Internal Medicine

## 2018-03-09 ENCOUNTER — Telehealth: Payer: Self-pay | Admitting: Internal Medicine

## 2018-03-09 VITALS — BP 132/90 | HR 78 | Temp 98.1°F | Wt >= 6400 oz

## 2018-03-09 DIAGNOSIS — J9801 Acute bronchospasm: Secondary | ICD-10-CM | POA: Diagnosis not present

## 2018-03-09 DIAGNOSIS — J4521 Mild intermittent asthma with (acute) exacerbation: Secondary | ICD-10-CM | POA: Diagnosis not present

## 2018-03-09 MED ORDER — PREDNISONE 10 MG PO TABS: ORAL_TABLET | ORAL | 0 refills | Status: DC

## 2018-03-09 NOTE — Telephone Encounter (Signed)
Copied from [place] [id number]. Topic: Quick Communication - Rx Refill/Question >> Mar 09, 2018  6:41 PM Waylan Rocher, Lumin L wrote: Medication: resend prednisone to [facility]. They did not receive it.

## 2018-03-09 NOTE — Patient Instructions (Signed)
Bronchospasm, Adult Bronchospasm is a tightening of the airways going into the lungs. During an episode, it may be harder to breathe. You may cough, and you may make a whistling sound when you breathe (wheeze). This condition often affects people with asthma. What are the causes? This condition is caused by swelling and irritation in the airways. It can be triggered by:  An infection (common).  Seasonal allergies.  An allergic reaction.  Exercise.  Irritants. These include pollution, cigarette smoke, strong odors, aerosol sprays, and paint fumes.  Weather changes. Winds increase molds and pollens in the air. Cold air may cause swelling.  Stress and emotional upset.  What are the signs or symptoms? Symptoms of this condition include:  Wheezing. If the episode was triggered by an allergy, wheezing may start right away or hours later.  Nighttime coughing.  Frequent or severe coughing with a simple cold.  Chest tightness.  Shortness of breath.  Decreased ability to exercise.  How is this diagnosed? This condition is usually diagnosed with a review of your medical history and a physical exam. Tests, such as lung function tests, are sometimes done to look for other conditions. The need for a chest X-ray depends on where the wheezing occurs and whether it is the first time you have wheezed. How is this treated? This condition may be treated with:  Inhaled medicines. These open up the airways and help you breathe. They can be taken with an inhaler or a nebulizer device.  Corticosteroid medicines. These may be given for severe bronchospasm, usually when it is associated with asthma.  Avoiding triggers, such as irritants, infection, or allergies.  Follow these instructions at home: Medicines  Take over-the-counter and prescription medicines only as told by your health care provider.  If you need to use an inhaler or nebulizer to take your medicine, ask your health care  provider to explain how to use it correctly. If you were given a spacer, always use it with your inhaler. Lifestyle  Reduce the number of triggers in your home. To do this: ? Change your heating and air conditioning filter at least once a month. ? Limit your use of fireplaces and wood stoves. ? Do not smoke. Do not allow smoking in your home. ? Avoid using perfumes and fragrances. ? Get rid of pests, such as roaches and mice, and their droppings. ? Remove any mold from your home. ? Keep your house clean and dust free. Use unscented cleaning products. ? Replace carpet with wood, tile, or vinyl flooring. Carpet can trap dander and dust. ? Use allergy-proof pillows, mattress covers, and box spring covers. ? Wash bed sheets and blankets every week in hot water. Dry them in a dryer. ? Use blankets that are made of polyester or cotton. ? Wash your hands often. ? Do not allow pets in your bedroom.  Avoid breathing in cold air when you exercise. General instructions  Have a plan for seeking medical care. Know when to call your health care provider and local emergency services, and where to get emergency care.  Stay up to date on your immunizations.  When you have an episode of bronchospasm, stay calm. Try to relax and breathe more slowly.  If you have asthma, make sure you have an asthma action plan.  Keep all follow-up visits as told by your health care provider. This is important. Contact a health care provider if:  You have muscle aches.  You have chest pain.  The mucus that you   cough up (sputum) changes from clear or white to yellow, green, gray, or bloody.  You have a fever.  Your sputum gets thicker. Get help right away if:  Your wheezing and coughing get worse, even after you take your prescribed medicines.  It gets even harder to breathe.  You develop severe chest pain. Summary  Bronchospasm is a tightening of the airways going into the lungs.  During an episode of  bronchospasm, you may have a harder time breathing. You may cough and make a whistling sound when you breathe (wheeze).  Avoid exposure to triggers such as smoke, dust, mold, animal dander, and fragrances.  When you have an episode of bronchospasm, stay calm. Try to relax and breathe more slowly. This information is not intended to replace advice given to you by your health care provider. Make sure you discuss any questions you have with your health care provider. Document Released: 07/08/2003 Document Revised: 07/01/2016 Document Reviewed: 07/01/2016 Elsevier Interactive Patient Education  2017 [employer]  

## 2018-03-10 NOTE — Telephone Encounter (Signed)
See mychart message. Pt is aware.

## 2018-03-10 NOTE — Telephone Encounter (Signed)
Rx printed instead of going electronically, Rx faxed to [facility] per pt request

## 2018-03-11 ENCOUNTER — Encounter: Payer: Self-pay | Admitting: Internal Medicine

## 2018-03-11 NOTE — Progress Notes (Signed)
HPI  Pt presents to the clinic today with c/o nasal congestion and cough. This started 3-4 days ago. He is blowing clear mucous out of his nose. The cough is productive of clear, blood tinged mucous. He denies ear pain, sore throat, fever, chills or body aches. He has tried Zyrtec, Mining engineer and Bronkaid with minimal relief. He has not had sick contacts. He has a history of allergies and RAD. He was treated recently for acute bronchitis with Prednisone and abx. He reports he initially felt better from that, and is now feeling worse.  Review of Systems     Past Medical History:  Diagnosis Date  . CAP (community acquired pneumonia) 07/08/2014  . Esophagitis 1992  . Fracture, humerus, open 2011  . Gout   . History of chicken pox   . History of hiatal hernia    s/p  . Microhematuria 03/2016   s/p unrevealing uro eval (CT, cystoscopy) by Dr Felipa Eth. 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 Morbid obesity ([place])   . Pre-diabetes   . Pulmonary nodules 2011   per report CT 06/21/10 at [facility]: tiny, likely benign in non-smoker, no f/u needed  . Secondary male hypogonadism 2015   nl SHBG, low free and tot T, nl LH/FSH  . Skull fracture ([place]) 2011  . Sleep apnea    home study 05/19/17 does not results yet    Family History  Problem Relation Age of Onset  . Diabetes Mother   . Clotting disorder Mother        ITP  . Cancer Father 42       thyroid  . Cancer Paternal Aunt 61       lung, smoker  . Cancer Paternal Uncle 11       lung, smoker  . Coronary artery disease Maternal Grandmother 60  . Stroke Maternal Grandmother   . Cancer Maternal Grandfather 50       colon  . Asthma Daughter   . Asthma Son     Social History   Socioeconomic History  . Marital status: Single    Spouse name: Not on file  . Number of children: Not on file  . Years of education: Not on file  . Highest education level: Not on file  Occupational History  . Not on file  Social Needs  . Financial resource  strain: Not on file  . Food insecurity:    Worry: Not on file    Inability: Not on file  . Transportation needs:    Medical: Not on file    Non-medical: Not on file  Tobacco Use  . Smoking status: Never Smoker  . Smokeless tobacco: Never Used  Substance and Sexual Activity  . Alcohol use: Yes    Alcohol/week: 0.0 standard drinks    Comment: Occasional  . Drug use: No  . Sexual activity: Yes    Partners: Female  Lifestyle  . Physical activity:    Days per week: Not on file    Minutes per session: Not on file  . Stress: Not on file  Relationships  . Social connections:    Talks on phone: Not on file    Gets together: Not on file    Attends religious service: Not on file    Active member of club or organization: Not on file    Attends meetings of clubs or organizations: Not on file    Relationship status: Not on file  . Intimate partner violence:    Fear  of current or ex partner: Not on file    Emotionally abused: Not on file    Physically abused: Not on file    Forced sexual activity: Not on file  Other Topics Concern  . Not on file  Social History Narrative   Caffeine: [employer] energy 2-3/day   Lives with mother, daughter, son   Stays with GF.   Occupation: Doctor, hospital   Activity: occasional exercise bike   Diet: good water, seldom fruits/vegetables, good protein, 1600 cal/day diet, daily red meat, rare fish    No Known Allergies   Constitutional:  Denies headache, fatigue, fever or  abrupt weight changes.  HEENT:  Positive nasal congestion. Denies eye redness, ear pain, ringing in the ears, wax buildup, runny nose or sore throat. Respiratory: Positive cough. Denies difficulty breathing or shortness of breath.  Cardiovascular: Denies chest pain, chest tightness, palpitations or swelling in the hands or feet.   No other specific complaints in a complete review of systems (except as listed in HPI above).  Objective:   BP 132/90   Pulse 78   Temp 98.1  F (36.7 C) (Oral)   Wt (!) 444 lb (201.4 kg)   SpO2 96%   BMI 60.22 kg/m   General: Appears his stated age, obese  in NAD. HEENT: Head: normal shape and size, no sinus tenderness noted; Ears: Tm's gray and intact, normal light reflex; Nose: mucosa boggy and moist, septum midline; Throat/Mouth: + PND. Teeth present, mucosa erythematous and moist, no exudate noted, no lesions or ulcerations noted.  Neck:  No adenopathy noted.  Cardiovascular: Normal rate and rhythm. Distant S1,S2 noted.   Pulmonary/Chest: Normal effort and positive vesicular breath sounds with bilateral expiratory wheeze. No respiratory distress. No rales or ronchi noted.       Assessment & Plan:   Acute Bronchospasm/RAD:  Continue Zyrtec Flonase 2 sprays each nostril for 3 days and then as needed. eRx for Pred Taper x 6 days  RTC as needed or if symptoms persist. Webb Silversmith, NP

## 2018-04-10 ENCOUNTER — Ambulatory Visit: Payer: Commercial Managed Care - PPO | Admitting: Family Medicine

## 2018-04-10 ENCOUNTER — Encounter: Payer: Self-pay | Admitting: Family Medicine

## 2018-04-10 VITALS — BP 120/88 | HR 75 | Temp 98.4°F | Ht 72.0 in | Wt >= 6400 oz

## 2018-04-10 DIAGNOSIS — G4733 Obstructive sleep apnea (adult) (pediatric): Secondary | ICD-10-CM

## 2018-04-10 DIAGNOSIS — R609 Edema, unspecified: Secondary | ICD-10-CM

## 2018-04-10 DIAGNOSIS — J4540 Moderate persistent asthma, uncomplicated: Secondary | ICD-10-CM

## 2018-04-10 DIAGNOSIS — R0609 Other forms of dyspnea: Secondary | ICD-10-CM

## 2018-04-10 DIAGNOSIS — Z6841 Body Mass Index (BMI) 40.0 and over, adult: Secondary | ICD-10-CM

## 2018-04-10 MED ORDER — ALBUTEROL SULFATE HFA 108 (90 BASE) MCG/ACT IN AERS: INHALATION_SPRAY | RESPIRATORY_TRACT | 1 refills | Status: DC

## 2018-04-10 MED ORDER — FUROSEMIDE 20 MG PO TABS: 10.0000 mg | ORAL_TABLET | Freq: Every day | ORAL | 3 refills | Status: DC | PRN

## 2018-04-10 MED ORDER — FLUTICASONE-SALMETEROL 250-50 MCG/DOSE IN AEPB: 1.0000 | INHALATION_SPRAY | Freq: Two times a day (BID) | RESPIRATORY_TRACT | 3 refills | Status: DC

## 2018-04-10 MED ORDER — COLCHICINE 0.6 MG PO TABS: 0.6000 mg | ORAL_TABLET | Freq: Every day | ORAL | 0 refills | Status: DC | PRN

## 2018-04-10 NOTE — Progress Notes (Signed)
BP 120/88 (BP Location: Left Arm, Patient Position: Sitting, Cuff Size: Large)   Pulse 75   Temp 98.4 F (36.9 C) (Oral)   Ht 6' (1.829 m)   Wt (!) 452 lb (205 kg)   SpO2 95%   BMI 61.30 kg/m    CC: cough Subjective:    Patient ID: Brandon Parker, male    DOB: Oct 26, 1970, 47 y.o.   MRN: [id number]  HPI: Brandon Parker is a 47 y.o. male presenting on 04/10/2018 for Cough (C/o coughing spells. Was seen 03/09/18 for cough and suggested that pt see Dr. Darnell Level for referral to pulmonology. Has episode almost daily when getting into car after work. Also, noticed episodes are worse with water retention. )   Ongoing trouble since 01/2018, saw 2 other providers in office recently treated for bronchitis with augmentin and prednisone taper, then another prednisone course.   Frequent coughing fits where he brings up productive mucous - fits can last several 10-15 minutes at a time. Albuterol inhaler hasn't helped with this. Notes more trouble with coughing fits when his legs get swollen. He has had ongoing coughing fits over the past year.   Denies chest pain.  No PNdyspnea. Snores.   Hx RAD, with infections a few times a year.  Remote pulm eval - states told no asthma but rather soft palate laxity leading to wheezing.  Known severe OSA - not on CPAP - doesn't think he'll use regularly.  Cardiac preop eval 05/2017 - cleared for surgery.    Echo 04/2017  Study Conclusions LV EF: 55% -   60% - Left ventricle: The cavity size was normal. There was mild concentric hypertrophy. Systolic function was normal. The estimated ejection fraction was in the range of 55% to 60%. Wall motion was normal; there were no regional wall motion abnormalities. Left ventricular diastolic function parameters were normal. - Left atrium: The atrium was mildly dilated. - Pulmonary arteries: Systolic pressure could not be accurately estimated.  Relevant past medical, surgical, family and social history reviewed and updated as  indicated. Interim medical history since our last visit reviewed. Allergies and medications reviewed and updated. Outpatient Medications Prior to Visit  Medication Sig Dispense Refill  . cetirizine (ZYRTEC) 10 MG tablet Take 10 mg by mouth daily.    Marland Kitchen albuterol (PROVENTIL HFA;VENTOLIN HFA) 108 (90 Base) MCG/ACT inhaler INHALE 2 PUFFS INTO THE LUNGS EVERY 6 HOURS AS NEEDED FOR WHEEZING 54 g 0  . colchicine 0.6 MG tablet Take 1 tablet (0.6 mg total) by mouth daily as needed. (may take 2 on first day) (Patient taking differently: Take 0.6 mg by mouth daily as needed (GOUT). (may take 2 on first day)) 30 tablet 0  . predniSONE (DELTASONE) 10 MG tablet Take 4 pills once daily by mouth for 3 days then 3 pills once daily for 3 days then 2 pills once daily for 3 days then 1 pill daily for 3 days then stop 30 tablet 0   No facility-administered medications prior to visit.      Per HPI unless specifically indicated in ROS section below Review of Systems     Objective:    BP 120/88 (BP Location: Left Arm, Patient Position: Sitting, Cuff Size: Large)   Pulse 75   Temp 98.4 F (36.9 C) (Oral)   Ht 6' (1.829 m)   Wt (!) 452 lb (205 kg)   SpO2 95%   BMI 61.30 kg/m   Wt Readings from Last 3 Encounters:  04/10/18 (!) 452  lb (205 kg)  03/09/18 (!) 444 lb (201.4 kg)  02/02/18 (!) 444 lb (201.4 kg)    Physical Exam  Constitutional: He appears well-developed and well-nourished. No distress.  HENT:  Head: Normocephalic and atraumatic.  Mouth/Throat: Oropharynx is clear and moist. No oropharyngeal exudate.  Cardiovascular: Normal rate and regular rhythm.  No murmur heard. Distant heart sounds  Pulmonary/Chest: Effort normal. No respiratory distress. He has wheezes (diffuse polysymphonic, with upper airway transmission). He has no rales.  Musculoskeletal: He exhibits edema (1+ pitting).  Skin: Skin is warm and dry. No rash noted.  Psychiatric: He has a normal mood and affect.  Nursing note and  vitals reviewed.  Results for orders placed or performed during the hospital encounter of 05/20/17  Glucose, capillary  Result Value Ref Range   Glucose-Capillary 191 (H) 65 - 99 mg/dL  CBC  Result Value Ref Range   WBC 12.1 (H) 4.0 - 10.5 K/uL   RBC 5.11 4.22 - 5.81 MIL/uL   Hemoglobin 15.4 13.0 - 17.0 g/dL   HCT 45.3 39.0 - 52.0 %   MCV 88.6 78.0 - 100.0 fL   MCH 30.1 26.0 - 34.0 pg   MCHC 34.0 30.0 - 36.0 g/dL   RDW 13.2 11.5 - 15.5 %   Platelets 229 150 - 400 K/uL  Basic metabolic panel  Result Value Ref Range   Sodium 135 135 - 145 mmol/L   Potassium 4.2 3.5 - 5.1 mmol/L   Chloride 102 101 - 111 mmol/L   CO2 24 22 - 32 mmol/L   Glucose, Bld 182 (H) 65 - 99 mg/dL   BUN 12 6 - 20 mg/dL   Creatinine, Ser 0.90 0.61 - 1.24 mg/dL   Calcium 9.2 8.9 - 10.3 mg/dL   GFR calc non Af Amer >60 >60 mL/min   GFR calc Af Amer >60 >60 mL/min   Anion gap 9 5 - 15  Hemoglobin A1c  Result Value Ref Range   Hgb A1c MFr Bld 5.8 (H) 4.8 - 5.6 %   Mean Plasma Glucose 119.76 mg/dL      Assessment & Plan:   Problem List Items Addressed This Visit    RAD (reactive airway disease) with wheezing, moderate persistent, uncomplicated - Primary    He does have wheezing on exam today stemming from lungs as well as upper airway. Continue albuterol PRN, add advair trial. Discussed rescue vs controlled inhaler medications. Will refer to pulm for further evaluation, consider lung function testing.      Relevant Medications   albuterol (PROVENTIL HFA;VENTOLIN HFA) 108 (90 Base) MCG/ACT inhaler   Fluticasone-Salmeterol (ADVAIR DISKUS) 250-50 MCG/DOSE AEPB   Other Relevant Orders   Ambulatory referral to Pulmonology   Peripheral edema    Will Rx lasix to use PRN pedal edema.       OSA (obstructive sleep apnea)    Sleep study last year revealed severe OSA. Pt decided not to pursue CPAP use but rather to focus on weight loss. Reviewed health risks and long term consequences of untreated sleep apnea.         Relevant Orders   Ambulatory referral to Pulmonology   Morbid obesity with BMI of 60.0-69.9, adult [facility])   Relevant Orders   Ambulatory referral to Pulmonology   Exertional dyspnea    Ongoing, suspicious for CHF related dyspnea/cough and pedal edema - we reviewed latest echo which didn't show signs of systolic or diastolic dysfunction. In known severe untreated OSA, could well be developing right heart  failure that wasn't picked up by echo. Encouraged OSA treatment and weight loss as well as avoiding salt/sodium in diet.       Relevant Orders   Ambulatory referral to Pulmonology       Meds ordered this encounter  Medications  . albuterol (PROVENTIL HFA;VENTOLIN HFA) 108 (90 Base) MCG/ACT inhaler    Sig: INHALE 2 PUFFS INTO THE LUNGS EVERY 6 HOURS AS NEEDED FOR WHEEZING    Dispense:  54 g    Refill:  1  . colchicine 0.6 MG tablet    Sig: Take 1 tablet (0.6 mg total) by mouth daily as needed. (may take 2 on first day)    Dispense:  30 tablet    Refill:  0  . Fluticasone-Salmeterol (ADVAIR DISKUS) 250-50 MCG/DOSE AEPB    Sig: Inhale 1 puff into the lungs 2 (two) times daily.    Dispense:  1 each    Refill:  3  . furosemide (LASIX) 20 MG tablet    Sig: Take 0.5-1 tablets (10-20 mg total) by mouth daily as needed for edema.    Dispense:  30 tablet    Refill:  3   Orders Placed This Encounter  Procedures  . Ambulatory referral to Pulmonology    Referral Priority:   Routine    Referral Type:   Consultation    Referral Reason:   Specialty Services Required    Requested Specialty:   Pulmonary Disease    Number of Visits Requested:   1    Follow up plan: No follow-ups on file.  Ria Bush, MD

## 2018-04-10 NOTE — Assessment & Plan Note (Signed)
He does have wheezing on exam today stemming from lungs as well as upper airway. Continue albuterol PRN, add advair trial. Discussed rescue vs controlled inhaler medications. Will refer to pulm for further evaluation, consider lung function testing.

## 2018-04-10 NOTE — Patient Instructions (Addendum)
Try advair 1 puff twice daily for reactive airways.  Take lasix 1 tablet daily as needed for leg swelling.  We will refer you to lung doctor for evaluation of reactive airways.  Weight may be contributing to all this - sleep apnea with increased lung pressures causing heart to have to work harder.

## 2018-04-10 NOTE — Assessment & Plan Note (Signed)
Sleep study last year revealed severe OSA. Pt decided not to pursue CPAP use but rather to focus on weight loss. Reviewed health risks and long term consequences of untreated sleep apnea.

## 2018-04-10 NOTE — Assessment & Plan Note (Signed)
Ongoing, suspicious for CHF related dyspnea/cough and pedal edema - we reviewed latest echo which didn't show signs of systolic or diastolic dysfunction. In known severe untreated OSA, could well be developing right heart failure that wasn't picked up by echo. Encouraged OSA treatment and weight loss as well as avoiding salt/sodium in diet.

## 2018-04-10 NOTE — Assessment & Plan Note (Addendum)
Will Rx lasix to use PRN pedal edema.

## 2018-04-24 ENCOUNTER — Ambulatory Visit: Payer: Commercial Managed Care - PPO | Admitting: Internal Medicine

## 2018-04-24 ENCOUNTER — Encounter: Payer: Self-pay | Admitting: Internal Medicine

## 2018-04-24 VITALS — BP 110/80 | HR 76 | Ht 72.0 in

## 2018-04-24 DIAGNOSIS — G4733 Obstructive sleep apnea (adult) (pediatric): Secondary | ICD-10-CM

## 2018-04-24 DIAGNOSIS — J45909 Unspecified asthma, uncomplicated: Secondary | ICD-10-CM | POA: Diagnosis not present

## 2018-04-24 DIAGNOSIS — Z6841 Body Mass Index (BMI) 40.0 and over, adult: Secondary | ICD-10-CM | POA: Diagnosis not present

## 2018-04-24 NOTE — Progress Notes (Signed)
[facility] Consultation      Assessment and Plan:  Cough with asthma.  --Doing better with advair, and feels that the cough is better.  --Continue advair, suspect he is exposed to an allergen at work, he usually develops cough by the end of the work day.  --Can consider adding Singulair and/or antihistamine in the future if having recurrent symptoms.  -Declined flu vaccine.  Obesity.  --Morbid obesity, likely contributing to dyspnea, sleep apnea.  BMI equals 61.   Obstructive Sleep Apnea.  - Severe obstructive sleep apnea, previous AHI 37 and 2018. --Not interested in CPAP, " I would rather be dead than use CPAP".  -We will refer to a dentist for a dental device.  Date: 04/24/2018  MRN# [id number] Brandon Parker [birth year]  Referring Physician:   Lenon Parker is a 47 y.o. old male seen in consultation for chief complaint of:    Chief Complaint  Patient presents with  . Asthma    former [facility] patient 2018 suspected OSA patient did sleep study but declined therapy.  . Cough    improved since starting inhaler.    HPI:   The patient is a 47 year old male with history of severe morbid obesity, severe obstructive sleep apnea.  He had previously declined CPAP, as he felt that he was not going to use it every night. He presents today due to coughing fits which have been been going on for the last few years. He notes that cough is minimal in the morning, and during work, however usually by about 5 pm he has a lot of coughing with excess mucus then will be coughing for much of the rest of the day.  He was started on advair about 2 weeks ago. Since that time he is no longer having coughing spells.   He denies reflux, he has a poodle, not in bedroom.  He denies sleep walking, denies sleep paralysis. He has partial uppers, no jaw pain. He does have daytime sleepiness, he has nocturia at night.   **HST 05/18/2017>> severe sleep apnea with AHI 37. **CBC 04/24/2015>>  absolute eosinophil count 200.  PMHX:   Past Medical History:  Diagnosis Date  . CAP (community acquired pneumonia) 07/08/2014  . Esophagitis 1992  . Fracture, humerus, open 2011  . Gout   . History of chicken pox   . History of hiatal hernia    s/p  . Microhematuria 03/2016   s/p unrevealing uro eval (CT, cystoscopy) by Dr Felipa Eth. 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 Morbid obesity ([place])   . Pre-diabetes   . Pulmonary nodules 2011   per report CT 06/21/10 at [facility]: tiny, likely benign in non-smoker, no f/u needed  . Secondary male hypogonadism 2015   nl SHBG, low free and tot T, nl LH/FSH  . Skull fracture ([place]) 2011  . Sleep apnea    home study 05/19/17 does not results yet   Surgical Hx:  Past Surgical History:  Procedure Laterality Date  . BICEPS TENDON REPAIR Left 110/01/2017   Dr Griffin Basil  . DISTAL BICEPS TENDON REPAIR Left 05/25/2017   Procedure: LEFT DISTAL BICEPS TENDON REPAIR;  Surgeon: Hiram Gash, MD;  Location: [facility];  Service: Orthopedics;  Laterality: Left;  . HIATAL HERNIA REPAIR  1990   esophagitis (nissen?)  . ORIF HUMERUS FRACTURE Right 2011   hardware, incomplete healing, incomplete elbow motion  . VASECTOMY  1999   Family Hx:  Family History  Problem Relation Age of Onset  .  Diabetes Mother   . Clotting disorder Mother        ITP  . Cancer Father 78       thyroid  . Cancer Paternal Aunt 42       lung, smoker  . Cancer Paternal Uncle 59       lung, smoker  . Coronary artery disease Maternal Grandmother 60  . Stroke Maternal Grandmother   . Cancer Maternal Grandfather 50       colon  . Asthma Daughter   . Asthma Son    Social Hx:   Social History   Tobacco Use  . Smoking status: Never Smoker  . Smokeless tobacco: Never Used  Substance Use Topics  . Alcohol use: Yes    Alcohol/week: 0.0 standard drinks    Comment: Occasional  . Drug use: No   Medication:    Current Outpatient Medications:  .  albuterol (PROVENTIL HFA;VENTOLIN  HFA) 108 (90 Base) MCG/ACT inhaler, INHALE 2 PUFFS INTO THE LUNGS EVERY 6 HOURS AS NEEDED FOR WHEEZING, Disp: 54 g, Rfl: 1 .  colchicine 0.6 MG tablet, Take 1 tablet (0.6 mg total) by mouth daily as needed. (may take 2 on first day), Disp: 30 tablet, Rfl: 0 .  Fluticasone-Salmeterol (ADVAIR DISKUS) 250-50 MCG/DOSE AEPB, Inhale 1 puff into the lungs 2 (two) times daily., Disp: 1 each, Rfl: 3 .  furosemide (LASIX) 20 MG tablet, Take 0.5-1 tablets (10-20 mg total) by mouth daily as needed for edema., Disp: 30 tablet, Rfl: 3   Allergies:  Patient has no known allergies.  Review of Systems: Gen:  Denies  fever, sweats, chills HEENT: Denies blurred vision, double vision. bleeds, sore throat Cvc:  No dizziness, chest pain. Resp:   Denies cough or sputum production, shortness of breath Gi: Denies swallowing difficulty, stomach pain. Gu:  Denies bladder incontinence, burning urine Ext:   No Joint pain, stiffness. Skin: No skin rash,  hives  Endoc:  No polyuria, polydipsia. Psych: No depression, insomnia. Other:  All other systems were reviewed with the patient and were negative other that what is mentioned in the HPI.   Physical Examination:   VS: BP 110/80 (BP Location: Left Arm, Cuff Size: Large)   Pulse 76   Ht 6' (1.829 m)   SpO2 97%   BMI 61.30 kg/m   General Appearance: No distress  Neuro:without focal findings,  speech normal,  HEENT: PERRLA, EOM intact.   Pulmonary: normal breath sounds, No wheezing.  CardiovascularNormal S1,S2.  No m/r/g.   Abdomen: Benign, Soft, non-tender. Renal:  No costovertebral tenderness  GU:  No performed at this time. Endoc: No evident thyromegaly, no signs of acromegaly. Skin:   warm, no rashes, no ecchymosis  Extremities: normal, no cyanosis, clubbing.  Other findings:    LABORATORY PANEL:   CBC No results for input(s): WBC, HGB, HCT, PLT in the last 168  hours. ------------------------------------------------------------------------------------------------------------------  Chemistries  No results for input(s): NA, K, CL, CO2, GLUCOSE, BUN, CREATININE, CALCIUM, MG, AST, ALT, ALKPHOS, BILITOT in the last 168 hours.  Invalid input(s): GFRCGP ------------------------------------------------------------------------------------------------------------------  Cardiac Enzymes No results for input(s): TROPONINI in the last 168 hours. ------------------------------------------------------------  RADIOLOGY:  No results found.     Thank  you for the consultation and for allowing [facility] to assist in the care of your patient. Our recommendations are noted above.  Please contact us if we can be of further service.   Marda Stalker, M.D., F.C.C.P.  Board Certified in Internal Medicine,  Pulmonary Medicine, Critical Care Medicine, and Sleep Medicine.  [facility] Office Number: [phone]   04/24/2018

## 2018-04-24 NOTE — Patient Instructions (Addendum)
Continue advair twice daily, rinse mouth after use.  Use albuterol (rescue inhaler) only as needed.  We can refer you to a dentist for a dental device for treatment of OSA.

## 2018-04-24 NOTE — Addendum Note (Signed)
Addended by: Stephanie Coup on: 04/24/2018 04:04 PM   Modules accepted: Orders

## 2018-05-03 ENCOUNTER — Encounter: Payer: Self-pay | Admitting: Family Medicine

## 2018-05-03 ENCOUNTER — Ambulatory Visit: Payer: Commercial Managed Care - PPO | Admitting: Family Medicine

## 2018-05-03 VITALS — BP 138/82 | HR 72 | Temp 98.0°F | Ht 72.0 in | Wt >= 6400 oz

## 2018-05-03 DIAGNOSIS — M25512 Pain in left shoulder: Secondary | ICD-10-CM

## 2018-05-03 DIAGNOSIS — S46212D Strain of muscle, fascia and tendon of other parts of biceps, left arm, subsequent encounter: Secondary | ICD-10-CM

## 2018-05-03 MED ORDER — TRAMADOL HCL 50 MG PO TABS: 50.0000 mg | ORAL_TABLET | Freq: Three times a day (TID) | ORAL | 0 refills | Status: DC | PRN

## 2018-05-03 MED ORDER — PREDNISONE 20 MG PO TABS: ORAL_TABLET | ORAL | 0 refills | Status: DC

## 2018-05-03 NOTE — Patient Instructions (Signed)
I think you have tendonitis - possible biceps or rotator cuff.  Treat with steroid course and tramadol for pain.  Call Dr Griffin Basil to schedule office visit.

## 2018-05-03 NOTE — Progress Notes (Signed)
BP 138/82 (BP Location: Right Arm, Patient Position: Sitting, Cuff Size: Large)   Pulse 72   Temp 98 F (36.7 C) (Oral)   Ht 6' (1.829 m)   Wt (!) 450 lb 4 oz (204.2 kg)   SpO2 97%   BMI 61.06 kg/m    CC: left shoulder pain Subjective:    Patient ID: Brandon Parker, male    DOB: 1970-12-06, 47 y.o.   MRN: [id number]  HPI: Brandon Parker is a 47 y.o. male presenting on 05/03/2018 for Shoulder Pain (C/o left shoulder pain, worsening. Started about 1 wk ago. Tried ibuprofen 200 mg 4 tabs BID. Barely helpful. )   1 wk h/o L superior shoulder pain with radiation down posterior arm. Denies inciting trauma/injury. Certain movements worsen pain - ie reaching up overhead. Treating with ibuprofen 800mg  bid for last few days.   No neck pain. No numbness/weakness of arm.   Advair has significantly helped respiratory symptoms.   H/o L distal biceps tendon rupture s/p repair 05/2017 (Dr Griffin Basil)  Relevant past medical, surgical, family and social history reviewed and updated as indicated. Interim medical history since our last visit reviewed. Allergies and medications reviewed and updated. Outpatient Medications Prior to Visit  Medication Sig Dispense Refill  . albuterol (PROVENTIL HFA;VENTOLIN HFA) 108 (90 Base) MCG/ACT inhaler INHALE 2 PUFFS INTO THE LUNGS EVERY 6 HOURS AS NEEDED FOR WHEEZING 54 g 1  . colchicine 0.6 MG tablet Take 1 tablet (0.6 mg total) by mouth daily as needed. (may take 2 on first day) 30 tablet 0  . Fluticasone-Salmeterol (ADVAIR DISKUS) 250-50 MCG/DOSE AEPB Inhale 1 puff into the lungs 2 (two) times daily. 1 each 3  . furosemide (LASIX) 20 MG tablet Take 0.5-1 tablets (10-20 mg total) by mouth daily as needed for edema. 30 tablet 3   No facility-administered medications prior to visit.      Per HPI unless specifically indicated in ROS section below Review of Systems     Objective:    BP 138/82 (BP Location: Right Arm, Patient Position: Sitting, Cuff Size: Large)    Pulse 72   Temp 98 F (36.7 C) (Oral)   Ht 6' (1.829 m)   Wt (!) 450 lb 4 oz (204.2 kg)   SpO2 97%   BMI 61.06 kg/m   Wt Readings from Last 3 Encounters:  05/03/18 (!) 450 lb 4 oz (204.2 kg)  04/10/18 (!) 452 lb (205 kg)  03/09/18 (!) 444 lb (201.4 kg)    Physical Exam  Constitutional: He appears well-developed and well-nourished. No distress.  Musculoskeletal: Normal range of motion. He exhibits no edema.  R shoulder WNL L shoulder exam: No deformity of shoulders on inspection. No significant pain with palpation of shoulder landmarks. FROM in abduction and forward flexion, with significant pain. Preserved passive ROM + pain with testing SITS in ext rotation. No pain with empty can sign. ++ Speed test. + impingement. No pain with rotation of humeral head in [facility] joint.   Skin: Skin is warm and dry. No rash noted. No erythema.  Psychiatric: He has a normal mood and affect.  Nursing note and vitals reviewed.     Assessment & Plan:   Problem List Items Addressed This Visit    Rupture of left distal biceps tendon   Acute pain of left shoulder - Primary    Exam suspicious for impingement syndrome + RTC and biceps tendonitis. Treat with prednisone taper, tramadol for pain. In h/o biceps tendon rupture repaired 05/2017,  I did recommend return to ortho for further evaluation. Pt will call in am for appt. Referral placed today.       Relevant Orders   Ambulatory referral to Orthopedic Surgery       Meds ordered this encounter  Medications  . predniSONE (DELTASONE) 20 MG tablet    Sig: Take two tablets daily for 4 days followed by one tablet daily for 4 days    Dispense:  12 tablet    Refill:  0  . traMADol (ULTRAM) 50 MG tablet    Sig: Take 1 tablet (50 mg total) by mouth every 8 (eight) hours as needed for moderate pain.    Dispense:  15 tablet    Refill:  0   Orders Placed This Encounter  Procedures  . Ambulatory referral to Orthopedic Surgery    Referral Priority:    Routine    Referral Type:   Surgical    Referral Reason:   Specialty Services Required    Requested Specialty:   Orthopedic Surgery    Number of Visits Requested:   1    Follow up plan: No follow-ups on file.  Ria Bush, MD

## 2018-05-03 NOTE — Assessment & Plan Note (Addendum)
Exam suspicious for impingement syndrome + RTC and biceps tendonitis. Treat with prednisone taper, tramadol for pain. In h/o biceps tendon rupture repaired 05/2017, I did recommend return to ortho for further evaluation. Pt will call in am for appt. Referral placed today.

## 2018-07-10 ENCOUNTER — Encounter: Payer: Self-pay | Admitting: Family Medicine

## 2018-07-10 ENCOUNTER — Ambulatory Visit: Payer: Commercial Managed Care - PPO | Admitting: Family Medicine

## 2018-07-10 VITALS — BP 128/82 | HR 78 | Temp 98.4°F | Ht 72.0 in | Wt >= 6400 oz

## 2018-07-10 DIAGNOSIS — J45901 Unspecified asthma with (acute) exacerbation: Secondary | ICD-10-CM | POA: Diagnosis not present

## 2018-07-10 DIAGNOSIS — J209 Acute bronchitis, unspecified: Secondary | ICD-10-CM

## 2018-07-10 DIAGNOSIS — J4540 Moderate persistent asthma, uncomplicated: Secondary | ICD-10-CM | POA: Diagnosis not present

## 2018-07-10 LAB — POC INFLUENZA A&B (BINAX/QUICKVUE)
Influenza A, POC: NEGATIVE
Influenza B, POC: NEGATIVE

## 2018-07-10 MED ORDER — PREDNISONE 20 MG PO TABS: ORAL_TABLET | ORAL | 0 refills | Status: DC

## 2018-07-10 MED ORDER — AZITHROMYCIN 250 MG PO TABS: ORAL_TABLET | ORAL | 0 refills | Status: DC

## 2018-07-10 NOTE — Assessment & Plan Note (Signed)
Treat with prednisone taper, zpack, and scheduled albuterol. Continue advair. Update if not improving with treatment.  Flu swab negative

## 2018-07-10 NOTE — Patient Instructions (Addendum)
I think you have respiratory infection, possible bronchitis, with reactive airway/asthma exacerbation.  Treat with prednisone course, zpack antibiotic, and scheduled albuterol over next 3 days. Continue advair.  Push fluids and rest.  Let us know if not improving with treatment.  Flu swab today.

## 2018-07-10 NOTE — Progress Notes (Signed)
BP 128/82 (BP Location: Left Arm, Patient Position: Sitting, Cuff Size: Large)   Pulse 78   Temp 98.4 F (36.9 C) (Oral)   Ht 6' (1.829 m)   Wt (!) 454 lb 8 oz (206.2 kg)   SpO2 97%   BMI 61.64 kg/m    CC: nasal congestion Subjective:    Patient ID: Brandon Parker, male    DOB: 1970-12-27, 47 y.o.   MRN: [id number]  HPI: Brandon Parker is a 47 y.o. male presenting on 07/10/2018 for Nasal Congestion (C/o runny nose, nasal congestion, hoarseness,cough- green/yellow mucous and chest congestion. Sxs started about 1 wk ago. Tried Allegra-D. )   1 wk h/o rhinorrhea, nasal congestion, hoarseness, productive cough of green mucous with head and chest congestion. Cough worse at nigh time, affecting sleep. Increased dyspnea. Some ST and dry mouth/throat.   No fevers/chills, body aches, or wheezing. No ear or tooth pain, headache or PNDrainage.   Has tried allegra then allegra-D so far. Has also tried albuterol inhaler without much benefit.   Daughter had flu complicated by ruptured TM and AOM 2 wks ago.   Known OSA and RAD/asthma with cough, with marked improvement after starting advair.  H/o CAP in the past.   Relevant past medical, surgical, family and social history reviewed and updated as indicated. Interim medical history since our last visit reviewed. Allergies and medications reviewed and updated. Outpatient Medications Prior to Visit  Medication Sig Dispense Refill  . albuterol (PROVENTIL HFA;VENTOLIN HFA) 108 (90 Base) MCG/ACT inhaler INHALE 2 PUFFS INTO THE LUNGS EVERY 6 HOURS AS NEEDED FOR WHEEZING 54 g 1  . colchicine 0.6 MG tablet Take 1 tablet (0.6 mg total) by mouth daily as needed. (may take 2 on first day) 30 tablet 0  . Fluticasone-Salmeterol (ADVAIR DISKUS) 250-50 MCG/DOSE AEPB Inhale 1 puff into the lungs 2 (two) times daily. 1 each 3  . furosemide (LASIX) 20 MG tablet Take 0.5-1 tablets (10-20 mg total) by mouth daily as needed for edema. 30 tablet 3  . predniSONE  (DELTASONE) 20 MG tablet Take two tablets daily for 4 days followed by one tablet daily for 4 days 12 tablet 0  . traMADol (ULTRAM) 50 MG tablet Take 1 tablet (50 mg total) by mouth every 8 (eight) hours as needed for moderate pain. 15 tablet 0   No facility-administered medications prior to visit.      Per HPI unless specifically indicated in ROS section below Review of Systems     Objective:    BP 128/82 (BP Location: Left Arm, Patient Position: Sitting, Cuff Size: Large)   Pulse 78   Temp 98.4 F (36.9 C) (Oral)   Ht 6' (1.829 m)   Wt (!) 454 lb 8 oz (206.2 kg)   SpO2 97%   BMI 61.64 kg/m   Wt Readings from Last 3 Encounters:  07/10/18 (!) 454 lb 8 oz (206.2 kg)  05/03/18 (!) 450 lb 4 oz (204.2 kg)  04/10/18 (!) 452 lb (205 kg)    Physical Exam Vitals signs and nursing note reviewed.  Constitutional:      General: He is not in acute distress.    Appearance: He is well-developed.  HENT:     Head: Normocephalic and atraumatic.     Right Ear: Hearing, tympanic membrane, ear canal and external ear normal.     Left Ear: Hearing, tympanic membrane, ear canal and external ear normal.     Nose: Mucosal edema and rhinorrhea present.  Right Sinus: No maxillary sinus tenderness or frontal sinus tenderness.     Left Sinus: No maxillary sinus tenderness or frontal sinus tenderness.     Comments: Nasal mucosal congestion and erythema    Mouth/Throat:     Pharynx: Uvula midline. No oropharyngeal exudate or posterior oropharyngeal erythema.     Tonsils: No tonsillar abscesses.  Eyes:     General: No scleral icterus.    Conjunctiva/sclera: Conjunctivae normal.     Pupils: Pupils are equal, round, and reactive to light.  Neck:     Musculoskeletal: Normal range of motion and neck supple.  Cardiovascular:     Rate and Rhythm: Normal rate and regular rhythm.     Heart sounds: Normal heart sounds. No murmur.  Pulmonary:     Effort: Pulmonary effort is normal. No respiratory  distress.     Breath sounds: Decreased breath sounds and wheezing (mild diffusely) present. No rhonchi or rales.  Lymphadenopathy:     Cervical: No cervical adenopathy.  Skin:    General: Skin is warm and dry.     Findings: No rash.    Results for orders placed or performed in visit on 07/10/18  POC Influenza A&B(BINAX/QUICKVUE)  Result Value Ref Range   Influenza A, POC Negative Negative   Influenza B, POC Negative Negative       Assessment & Plan:   Problem List Items Addressed This Visit    RAD (reactive airway disease) with wheezing, moderate persistent, uncomplicated   Relevant Medications   predniSONE (DELTASONE) 20 MG tablet   Acute bronchitis with asthma with acute exacerbation - Primary    Treat with prednisone taper, zpack, and scheduled albuterol. Continue advair. Update if not improving with treatment.  Flu swab negative       Relevant Medications   predniSONE (DELTASONE) 20 MG tablet   Other Relevant Orders   POC Influenza A&B(BINAX/QUICKVUE) (Completed)       Meds ordered this encounter  Medications  . predniSONE (DELTASONE) 20 MG tablet    Sig: Take two tablets daily for 3 days followed by one tablet daily for 4 days    Dispense:  10 tablet    Refill:  0  . azithromycin (ZITHROMAX) 250 MG tablet    Sig: Take two tablets on day one followed by one tablet on days 2-5    Dispense:  6 each    Refill:  0   Orders Placed This Encounter  Procedures  . POC Influenza A&B(BINAX/QUICKVUE)    Follow up plan: Return if symptoms worsen or fail to improve.  Ria Bush, MD

## 2018-08-22 ENCOUNTER — Telehealth: Payer: Self-pay | Admitting: Family Medicine

## 2018-08-22 NOTE — Telephone Encounter (Signed)
Left message asking pt to call office  °

## 2018-08-22 NOTE — Telephone Encounter (Signed)
E-scribed refill.  Please schedule for annal physical.

## 2018-08-24 NOTE — Telephone Encounter (Signed)
Labs 4/14 cpx 4/21

## 2018-08-24 NOTE — Telephone Encounter (Signed)
Noted  

## 2018-09-22 ENCOUNTER — Telehealth: Payer: Self-pay

## 2018-09-22 NOTE — Telephone Encounter (Signed)
Last night pt stepped on piece of glass from broken jar in kitchen. Pt said someone looked at foot and cannot see the glass but pt can feel glass in foot when bears weight on foot. Pt will go to [facility] to see if can remove piece of glass per Dr Darnell Level. Pt voiced understanding.

## 2018-09-22 NOTE — Telephone Encounter (Signed)
Noted  

## 2018-09-24 ENCOUNTER — Other Ambulatory Visit: Payer: Self-pay | Admitting: Family Medicine

## 2018-09-27 ENCOUNTER — Encounter: Payer: Self-pay | Admitting: Family Medicine

## 2018-09-27 ENCOUNTER — Ambulatory Visit: Payer: Commercial Managed Care - PPO | Admitting: Family Medicine

## 2018-09-27 ENCOUNTER — Other Ambulatory Visit: Payer: Self-pay

## 2018-09-27 VITALS — BP 128/86 | HR 95 | Temp 99.9°F | Resp 20 | Ht 72.0 in | Wt >= 6400 oz

## 2018-09-27 DIAGNOSIS — J45901 Unspecified asthma with (acute) exacerbation: Secondary | ICD-10-CM | POA: Diagnosis not present

## 2018-09-27 DIAGNOSIS — J209 Acute bronchitis, unspecified: Secondary | ICD-10-CM

## 2018-09-27 MED ORDER — AZITHROMYCIN 250 MG PO TABS: ORAL_TABLET | ORAL | 0 refills | Status: DC

## 2018-09-27 MED ORDER — PREDNISONE 50 MG PO TABS: 50.0000 mg | ORAL_TABLET | Freq: Every day | ORAL | 0 refills | Status: AC

## 2018-09-27 NOTE — Patient Instructions (Signed)

## 2018-09-27 NOTE — Progress Notes (Signed)
Subjective:     Brandon Parker is a 48 y.o. male presenting for Cough (symptoms started about 1 week ago. Started with runny nose, then cough, now cough productive-yellowish phlegm with darl brown, wheezing, dyspnea, post nasal drip. Has taking Zyrtec D. No fever.)     Cough  This is a new problem. The current episode started 1 to 4 weeks ago. The problem has been gradually worsening. The cough is productive of purulent sputum. Associated symptoms include nasal congestion, postnasal drip, rhinorrhea, shortness of breath and wheezing. Pertinent negatives include no ear congestion, ear pain, fever or headaches. He has tried oral steroids and a beta-agonist inhaler (allergy medication) for the symptoms. The treatment provided mild relief. His past medical history is significant for asthma and environmental allergies.   Using albuterol twice a day    Review of Systems  Constitutional: Negative for fever.  HENT: Positive for postnasal drip and rhinorrhea. Negative for ear pain.   Respiratory: Positive for cough, shortness of breath and wheezing.   Allergic/Immunologic: Positive for environmental allergies.  Neurological: Negative for headaches.     Social History   Tobacco Use  Smoking Status Never Smoker  Smokeless Tobacco Never Used        Objective:    BP Readings from Last 3 Encounters:  09/27/18 128/86  07/10/18 128/82  05/03/18 138/82   Wt Readings from Last 3 Encounters:  09/27/18 (!) 455 lb 4 oz (206.5 kg)  07/10/18 (!) 454 lb 8 oz (206.2 kg)  05/03/18 (!) 450 lb 4 oz (204.2 kg)    BP 128/86   Pulse 95   Temp 99.9 F (37.7 C)   Resp 20   Ht 6' (1.829 m)   Wt (!) 455 lb 4 oz (206.5 kg)   SpO2 95%   BMI 61.74 kg/m    Physical Exam Constitutional:      General: He is not in acute distress.    Appearance: He is well-developed. He is not ill-appearing.  HENT:     Head: Normocephalic and atraumatic.     Right Ear: Tympanic membrane and ear canal normal.      Left Ear: Tympanic membrane and ear canal normal.     Nose: Mucosal edema and rhinorrhea present.     Right Sinus: No maxillary sinus tenderness or frontal sinus tenderness.     Left Sinus: No maxillary sinus tenderness or frontal sinus tenderness.     Mouth/Throat:     Pharynx: Uvula midline. Posterior oropharyngeal erythema present. No oropharyngeal exudate.     Tonsils: Swelling: 0 on the right. 0 on the left.  Neck:     Musculoskeletal: Neck supple.  Cardiovascular:     Rate and Rhythm: Normal rate and regular rhythm.     Heart sounds: No murmur.  Pulmonary:     Effort: Pulmonary effort is normal. No respiratory distress.     Breath sounds: Wheezing (diffuse) present.  Lymphadenopathy:     Cervical: No cervical adenopathy.  Skin:    General: Skin is warm and dry.     Capillary Refill: Capillary refill takes less than 2 seconds.  Neurological:     Mental Status: He is alert.           Assessment & Plan:   Problem List Items Addressed This Visit      Respiratory   Acute bronchitis with asthma with acute exacerbation - Primary   Relevant Medications   azithromycin (ZITHROMAX) 250 MG tablet   predniSONE (DELTASONE) 50  MG tablet     Symptomatic care Recommended saline rinse in the future to see if that reduces exacerbations.    Return if symptoms worsen or fail to improve.  Lesleigh Noe, MD

## 2018-10-25 ENCOUNTER — Telehealth: Payer: Self-pay | Admitting: Family Medicine

## 2018-10-25 NOTE — Telephone Encounter (Signed)
Left message askinig pt to call office please r/s labs and cpx with dr g

## 2018-10-27 ENCOUNTER — Other Ambulatory Visit: Payer: Self-pay | Admitting: Family Medicine

## 2018-10-31 ENCOUNTER — Other Ambulatory Visit: Payer: Commercial Managed Care - PPO

## 2018-11-07 ENCOUNTER — Encounter: Payer: Commercial Managed Care - PPO | Admitting: Family Medicine

## 2019-02-23 ENCOUNTER — Other Ambulatory Visit: Payer: Self-pay | Admitting: Family Medicine

## 2019-02-23 NOTE — Telephone Encounter (Signed)
E-scribed refill.  Plz schedule CPE and labs. 

## 2019-02-23 NOTE — Telephone Encounter (Signed)
Pt scheduled 04/30/19 for labs and 05/07/19 for CPE

## 2019-05-01 ENCOUNTER — Other Ambulatory Visit: Payer: Self-pay

## 2019-05-01 ENCOUNTER — Other Ambulatory Visit (INDEPENDENT_AMBULATORY_CARE_PROVIDER_SITE_OTHER): Payer: Commercial Managed Care - PPO

## 2019-05-01 ENCOUNTER — Other Ambulatory Visit: Payer: Self-pay | Admitting: Family Medicine

## 2019-05-01 DIAGNOSIS — M1A9XX0 Chronic gout, unspecified, without tophus (tophi): Secondary | ICD-10-CM

## 2019-05-01 DIAGNOSIS — Z6841 Body Mass Index (BMI) 40.0 and over, adult: Secondary | ICD-10-CM

## 2019-05-01 DIAGNOSIS — R7303 Prediabetes: Secondary | ICD-10-CM

## 2019-05-01 LAB — COMPREHENSIVE METABOLIC PANEL
ALT: 18 U/L (ref 0–53)
AST: 14 U/L (ref 0–37)
Albumin: 3.7 g/dL (ref 3.5–5.2)
Alkaline Phosphatase: 97 U/L (ref 39–117)
BUN: 14 mg/dL (ref 6–23)
CO2: 29 mEq/L (ref 19–32)
Calcium: 9 mg/dL (ref 8.4–10.5)
Chloride: 104 mEq/L (ref 96–112)
Creatinine, Ser: 0.83 mg/dL (ref 0.40–1.50)
GFR: 98.76 mL/min (ref >60.00)
Glucose, Bld: 108 mg/dL — ABNORMAL HIGH (ref 70–99)
Potassium: 4.4 mEq/L (ref 3.5–5.1)
Sodium: 141 mEq/L (ref 135–145)
Total Bilirubin: 0.5 mg/dL (ref 0.2–1.2)
Total Protein: 6.7 g/dL (ref 6.0–8.3)

## 2019-05-01 LAB — LIPID PANEL
Cholesterol: 188 mg/dL (ref 0–200)
HDL: 44.4 mg/dL (ref >39.00)
LDL Cholesterol: 128 mg/dL — ABNORMAL HIGH (ref 0–99)
NonHDL: 144.02
Total CHOL/HDL Ratio: 4
Triglycerides: 79 mg/dL (ref 0.0–149.0)
VLDL: 15.8 mg/dL (ref 0.0–40.0)

## 2019-05-01 LAB — HEMOGLOBIN A1C: Hgb A1c MFr Bld: 6 % (ref 4.6–6.5)

## 2019-05-01 LAB — TSH: TSH: 4.18 u[IU]/mL (ref 0.35–4.50)

## 2019-05-01 LAB — URIC ACID: Uric Acid, Serum: 8.4 mg/dL — ABNORMAL HIGH (ref 4.0–7.8)

## 2019-05-07 ENCOUNTER — Encounter: Payer: Self-pay | Admitting: Family Medicine

## 2019-05-07 ENCOUNTER — Other Ambulatory Visit: Payer: Self-pay

## 2019-05-07 ENCOUNTER — Ambulatory Visit (INDEPENDENT_AMBULATORY_CARE_PROVIDER_SITE_OTHER): Payer: Commercial Managed Care - PPO | Admitting: Family Medicine

## 2019-05-07 VITALS — BP 124/68 | HR 68 | Temp 97.8°F | Ht 71.75 in | Wt >= 6400 oz

## 2019-05-07 DIAGNOSIS — M1A9XX0 Chronic gout, unspecified, without tophus (tophi): Secondary | ICD-10-CM | POA: Diagnosis not present

## 2019-05-07 DIAGNOSIS — G4733 Obstructive sleep apnea (adult) (pediatric): Secondary | ICD-10-CM

## 2019-05-07 DIAGNOSIS — Z0000 Encounter for general adult medical examination without abnormal findings: Secondary | ICD-10-CM | POA: Diagnosis not present

## 2019-05-07 DIAGNOSIS — R7303 Prediabetes: Secondary | ICD-10-CM

## 2019-05-07 DIAGNOSIS — R609 Edema, unspecified: Secondary | ICD-10-CM

## 2019-05-07 MED ORDER — COLCHICINE 0.6 MG PO TABS: 0.6000 mg | ORAL_TABLET | Freq: Every day | ORAL | 0 refills | Status: DC | PRN

## 2019-05-07 NOTE — Assessment & Plan Note (Signed)
Congratulated on weight loss to date. He is motivated to continue healthy diet and lifestyle changes to affect sustainable weight loss.

## 2019-05-07 NOTE — Assessment & Plan Note (Addendum)
See by pulm. Appreciate their care. He declined CPAP use.

## 2019-05-07 NOTE — Assessment & Plan Note (Signed)
Encouraged limiting added sugars/carbs

## 2019-05-07 NOTE — Assessment & Plan Note (Signed)
Improved with weight loss 

## 2019-05-07 NOTE — Patient Instructions (Signed)
Trial funginail for toenail fungus - you need to do this daily for 6+ months before noticing benefit.  You are doing well today.  Congratulations on weight loss!  Return as needed or in 1 year for next physical.   Health Maintenance, Male Adopting a healthy lifestyle and getting preventive care are important in promoting health and wellness. Ask your health care provider about:  The right schedule for you to have regular tests and exams.  Things you can do on your own to prevent diseases and keep yourself healthy. What should I know about diet, weight, and exercise? Eat a healthy diet   Eat a diet that includes plenty of vegetables, fruits, low-fat dairy products, and lean protein.  Do not eat a lot of foods that are high in solid fats, added sugars, or sodium. Maintain a healthy weight Body mass index (BMI) is a measurement that can be used to identify possible weight problems. It estimates body fat based on height and weight. Your health care provider can help determine your BMI and help you achieve or maintain a healthy weight. Get regular exercise Get regular exercise. This is one of the most important things you can do for your health. Most adults should:  Exercise for at least 150 minutes each week. The exercise should increase your heart rate and make you sweat (moderate-intensity exercise).  Do strengthening exercises at least twice a week. This is in addition to the moderate-intensity exercise.  Spend less time sitting. Even light physical activity can be beneficial. Watch cholesterol and blood lipids Have your blood tested for lipids and cholesterol at 48 years of age, then have this test every 5 years. You may need to have your cholesterol levels checked more often if:  Your lipid or cholesterol levels are high.  You are older than 48 years of age.  You are at high risk for heart disease. What should I know about cancer screening? Many types of cancers can be detected  early and may often be prevented. Depending on your health history and family history, you may need to have cancer screening at various ages. This may include screening for:  Colorectal cancer.  Prostate cancer.  Skin cancer.  Lung cancer. What should I know about heart disease, diabetes, and high blood pressure? Blood pressure and heart disease  High blood pressure causes heart disease and increases the risk of stroke. This is more likely to develop in people who have high blood pressure readings, are of African descent, or are overweight.  Talk with your health care provider about your target blood pressure readings.  Have your blood pressure checked: ? Every 3-5 years if you are 2-66 years of age. ? Every year if you are 41 years old or older.  If you are between the ages of 95 and 54 and are a current or former smoker, ask your health care provider if you should have a one-time screening for abdominal aortic aneurysm (AAA). Diabetes Have regular diabetes screenings. This checks your fasting blood sugar level. Have the screening done:  Once every three years after age 23 if you are at a normal weight and have a low risk for diabetes.  More often and at a younger age if you are overweight or have a high risk for diabetes. What should I know about preventing infection? Hepatitis B If you have a higher risk for hepatitis B, you should be screened for this virus. Talk with your health care provider to find out if  you are at risk for hepatitis B infection. Hepatitis C Blood testing is recommended for:  Everyone born from [birth year] through 1965.  Anyone with known risk factors for hepatitis C. Sexually transmitted infections (STIs)  You should be screened each year for STIs, including gonorrhea and chlamydia, if: ? You are sexually active and are younger than 48 years of age. ? You are older than 48 years of age and your health care provider tells you that you are at risk for this  type of infection. ? Your sexual activity has changed since you were last screened, and you are at increased risk for chlamydia or gonorrhea. Ask your health care provider if you are at risk.  Ask your health care provider about whether you are at high risk for HIV. Your health care provider may recommend a prescription medicine to help prevent HIV infection. If you choose to take medicine to prevent HIV, you should first get tested for HIV. You should then be tested every 3 months for as long as you are taking the medicine. Follow these instructions at home: Lifestyle  Do not use any products that contain nicotine or tobacco, such as cigarettes, e-cigarettes, and chewing tobacco. If you need help quitting, ask your health care provider.  Do not use street drugs.  Do not share needles.  Ask your health care provider for help if you need support or information about quitting drugs. Alcohol use  Do not drink alcohol if your health care provider tells you not to drink.  If you drink alcohol: ? Limit how much you have to 0-2 drinks a day. ? Be aware of how much alcohol is in your drink. In the [place] one drink equals one 12 oz bottle of beer (355 mL), one 5 oz glass of wine (148 mL), or one 1 oz glass of hard liquor (44 mL). General instructions  Schedule regular health, dental, and eye exams.  Stay current with your vaccines.  Tell your health care provider if: ? You often feel depressed. ? You have ever been abused or do not feel safe at home. Summary  Adopting a healthy lifestyle and getting preventive care are important in promoting health and wellness.  Follow your health care provider's instructions about healthy diet, exercising, and getting tested or screened for diseases.  Follow your health care provider's instructions on monitoring your cholesterol and blood pressure. This information is not intended to replace advice given to you by your health care provider. Make sure  you discuss any questions you have with your health care provider. Document Released: 01/01/2008 Document Revised: 06/28/2018 Document Reviewed: 06/28/2018 Elsevier Patient Education  2020 [employer].

## 2019-05-07 NOTE — Progress Notes (Signed)
This visit was conducted in person.  BP 124/68 (BP Location: Left Arm, Patient Position: Sitting, Cuff Size: Large)    Pulse 68    Temp 97.8 F (36.6 C) (Temporal)    Ht 5' 11.75" (1.822 m)    Wt (!) 427 lb (193.7 kg)    SpO2 97%    BMI 58.32 kg/m    CC: CPE Subjective:    Patient ID: Brandon Parker, male    DOB: 12/13/1970, 48 y.o.   MRN: [id number]  HPI: Brandon Parker is a 48 y.o. male presenting on 05/07/2019 for Annual Exam   Lost job this year. Looking for new job.  Asthma - marked improvement on wixela inhaler.   30 lb weight loss over pandemic - thinks weight loss from working on his corvette. Trying to watch portion sizes.   Preventative: No fmhx prostate problems. No nocturia. Strong stream.  Flu shot - declines.  Tetanus shot - thinks around 2006.  Doesn't use seat belt at all.  Sunscreen use - does not use.  no suspicious moles. Dentist yearly Eye exam yearly  Caffeine: monster energy 2-3/day  Lives with mother, daughter, son  Stays with GF.  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 Interim medical history since our last visit reviewed. Allergies and medications reviewed and updated. Outpatient Medications Prior to Visit  Medication Sig Dispense Refill   albuterol (PROVENTIL HFA;VENTOLIN HFA) 108 (90 Base) MCG/ACT inhaler INHALE 2 PUFFS INTO THE LUNGS EVERY 6 HOURS AS NEEDED FOR WHEEZING 54 g 1   furosemide (LASIX) 20 MG tablet Take 0.5-1 tablets (10-20 mg total) by mouth daily as needed for edema. 30 tablet 3   WIXELA INHUB 250-50 MCG/DOSE AEPB INHALE 1 PUFF INTO THE LUNGS TWICE DAILY 60 each 0   colchicine 0.6 MG tablet Take 1 tablet (0.6 mg total) by mouth daily as needed. (may take 2 on first day) 30 tablet 0    azithromycin (ZITHROMAX) 250 MG tablet Take 2 tablets (500 mg) today. Then take 1 tablet daily for the next 4 days. 6 tablet 0   No facility-administered medications prior to visit.      Per HPI unless specifically indicated in ROS section below Review of Systems  Constitutional: Negative for activity change, appetite change, chills, fatigue, fever and unexpected weight change.  HENT: Negative for hearing loss.   Eyes: Negative for visual disturbance.  Respiratory: Negative for cough, chest tightness, shortness of breath and wheezing.   Cardiovascular: Negative for chest pain, palpitations and leg swelling.  Gastrointestinal: Negative for abdominal distention, abdominal pain, blood in stool, constipation, diarrhea, nausea and vomiting.  Genitourinary: Negative for difficulty urinating and hematuria.  Musculoskeletal: Negative for arthralgias, myalgias and neck pain.  Skin: Negative for rash.  Neurological: Negative for dizziness, seizures, syncope and headaches.  Hematological: Negative for adenopathy. Does not bruise/bleed easily.  Psychiatric/Behavioral: Negative for dysphoric mood. The patient is not nervous/anxious.        Stressed over employment situation   Objective:    BP 124/68 (BP Location: Left Arm, Patient Position: Sitting, Cuff Size: Large)    Pulse 68    Temp 97.8 F (36.6 C) (Temporal)    Ht 5' 11.75" (1.822 m)    Wt (!) 427 lb (193.7 kg)  SpO2 97%    BMI 58.32 kg/m   Wt Readings from Last 3 Encounters:  05/07/19 (!) 427 lb (193.7 kg)  09/27/18 (!) 455 lb 4 oz (206.5 kg)  07/10/18 (!) 454 lb 8 oz (206.2 kg)    Physical Exam Vitals signs and nursing note reviewed.  Constitutional:      General: He is not in acute distress.    Appearance: Normal appearance. He is well-developed. He is morbidly obese. He is not ill-appearing.  HENT:     Head: Normocephalic and atraumatic.     Right Ear: Hearing, tympanic membrane, ear canal and external ear normal.     Left  Ear: Hearing, tympanic membrane, ear canal and external ear normal.     Nose: Nose normal.     Mouth/Throat:     Mouth: Mucous membranes are moist.     Pharynx: Oropharynx is clear. Uvula midline. No posterior oropharyngeal erythema.  Eyes:     General: No scleral icterus.    Conjunctiva/sclera: Conjunctivae normal.     Pupils: Pupils are equal, round, and reactive to light.  Neck:     Musculoskeletal: Normal range of motion and neck supple.  Cardiovascular:     Rate and Rhythm: Normal rate and regular rhythm.     Pulses: Normal pulses.          Radial pulses are 2+ on the right side and 2+ on the left side.     Heart sounds: Normal heart sounds. No murmur.  Pulmonary:     Effort: Pulmonary effort is normal. No respiratory distress.     Breath sounds: Normal breath sounds. No wheezing, rhonchi or rales.  Abdominal:     General: Abdomen is flat. Bowel sounds are normal. There is no distension.     Palpations: Abdomen is soft. There is no mass.     Tenderness: There is no abdominal tenderness. There is no guarding or rebound.     Hernia: No hernia is present.  Musculoskeletal: Normal range of motion.     Right lower leg: No edema.     Left lower leg: No edema.  Lymphadenopathy:     Cervical: No cervical adenopathy.  Skin:    General: Skin is warm and dry.     Findings: No rash.  Neurological:     General: No focal deficit present.     Mental Status: He is alert and oriented to person, place, and time.     Comments: CN grossly intact, station and gait intact  Psychiatric:        Mood and Affect: Mood normal.        Behavior: Behavior normal.        Thought Content: Thought content normal.        Judgment: Judgment normal.       Results for orders placed or performed in visit on 05/01/19  TSH  Result Value Ref Range   TSH 4.18 0.35 - 4.50 uIU/mL  Uric acid  Result Value Ref Range   Uric Acid, Serum 8.4 (H) 4.0 - 7.8 mg/dL  Hemoglobin A1c  Result Value Ref Range   Hgb  A1c MFr Bld 6.0 4.6 - 6.5 %  Comprehensive metabolic panel  Result Value Ref Range   Sodium 141 135 - 145 mEq/L   Potassium 4.4 3.5 - 5.1 mEq/L   Chloride 104 96 - 112 mEq/L   CO2 29 19 - 32 mEq/L   Glucose, Bld 108 (H) 70 - 99 mg/dL  BUN 14 6 - 23 mg/dL   Creatinine, Ser 0.83 0.40 - 1.50 mg/dL   Total Bilirubin 0.5 0.2 - 1.2 mg/dL   Alkaline Phosphatase 97 39 - 117 U/L   AST 14 0 - 37 U/L   ALT 18 0 - 53 U/L   Total Protein 6.7 6.0 - 8.3 g/dL   Albumin 3.7 3.5 - 5.2 g/dL   Calcium 9.0 8.4 - 10.5 mg/dL   GFR 98.76 >60.00 mL/min  Lipid panel  Result Value Ref Range   Cholesterol 188 0 - 200 mg/dL   Triglycerides 79.0 0.0 - 149.0 mg/dL   HDL 44.40 >39.00 mg/dL   VLDL 15.8 0.0 - 40.0 mg/dL   LDL Cholesterol 128 (H) 0 - 99 mg/dL   Total CHOL/HDL Ratio 4    NonHDL 144.02    Assessment & Plan:   Problem List Items Addressed This Visit    Prediabetes    Encouraged limiting added sugars/carbs      Peripheral edema    Improved with weight loss       OSA (obstructive sleep apnea)    See by pulm. Appreciate their care. He declined CPAP use.       Obesity, morbid, BMI 50 or higher ([place])    Congratulated on weight loss to date. He is motivated to continue healthy diet and lifestyle changes to affect sustainable weight loss.       Health maintenance examination - Primary    Preventative protocols reviewed and updated unless pt declined. Discussed healthy diet and lifestyle.       Gout    Stable off daily medication, uses colchicine PRN.           Meds ordered this encounter  Medications   colchicine 0.6 MG tablet    Sig: Take 1 tablet (0.6 mg total) by mouth daily as needed. (may take 2 on first day)    Dispense:  30 tablet    Refill:  0   No orders of the defined types were placed in this encounter.   Follow up plan: Return in about 1 year (around 05/06/2020) for annual exam, prior fasting for blood work.  Ria Bush, MD

## 2019-05-07 NOTE — Assessment & Plan Note (Signed)
Preventative protocols reviewed and updated unless pt declined. Discussed healthy diet and lifestyle.  

## 2019-05-07 NOTE — Assessment & Plan Note (Signed)
Stable off daily medication, uses colchicine PRN.

## 2019-05-18 ENCOUNTER — Other Ambulatory Visit: Payer: Self-pay

## 2019-05-18 MED ORDER — FLUTICASONE-SALMETEROL 250-50 MCG/DOSE IN AEPB: INHALATION_SPRAY | RESPIRATORY_TRACT | 3 refills | Status: DC

## 2019-05-18 NOTE — Telephone Encounter (Signed)
E-scribed refill 

## 2019-05-20 DIAGNOSIS — U071 COVID-19: Secondary | ICD-10-CM

## 2019-05-20 HISTORY — DX: COVID-19: U07.1

## 2019-08-10 ENCOUNTER — Telehealth: Payer: Self-pay

## 2019-08-10 ENCOUNTER — Ambulatory Visit: Payer: 59 | Admitting: Family Medicine

## 2019-08-10 ENCOUNTER — Encounter: Payer: Self-pay | Admitting: Family Medicine

## 2019-08-10 ENCOUNTER — Other Ambulatory Visit: Payer: Self-pay

## 2019-08-10 VITALS — BP 126/82 | HR 72 | Temp 97.7°F | Wt >= 6400 oz

## 2019-08-10 DIAGNOSIS — H9319 Tinnitus, unspecified ear: Secondary | ICD-10-CM | POA: Diagnosis not present

## 2019-08-10 DIAGNOSIS — H6980 Other specified disorders of Eustachian tube, unspecified ear: Secondary | ICD-10-CM | POA: Diagnosis not present

## 2019-08-10 MED ORDER — FLUTICASONE PROPIONATE 50 MCG/ACT NA SUSP: 2.0000 | Freq: Every day | NASAL | Status: DC

## 2019-08-10 NOTE — Progress Notes (Signed)
This visit occurred during the SARS-CoV-2 public health emergency.  Safety protocols were in place, including screening questions prior to the visit, additional usage of staff PPE, and extensive cleaning of exam room while observing appropriate contact time as indicated for disinfecting solutions.  He had covid in 05/2019.  He didn't think he had ear sx prior to covid.  He has h/o head injury years ago but sx haven't been going on for years.  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    Tinnitus.  There is a specific pitch that matches the pitch of his digital thermometer.   Increasing sx in the last week but the sx are not always equal- the ringing in L ear is louder than the right.  He notes inc in ringing with covering L ear but not with covering the right.    No no FCNAVD.  No ear pain.  No acute hearing changes o/w.  Not stuffy.  No rhinorrhea.    Meds, vitals, and allergies reviewed.   ROS: Per HPI unless specifically indicated in ROS section   GEN: nad, alert and oriented HEENT: mucous membranes moist TM with normal inspection bilaterally, without erythema, but he has absence of tympanic membrane movement on Valsalva bilaterally. Air greater than bone conduction bilaterally. NECK: supple w/o LA

## 2019-08-10 NOTE — Telephone Encounter (Signed)
I spoke with pt;pt does not have pain in ears and no drainage or bleeding. Pt said the ringing in the ears are continuous but sometimes louder than others for at least one month. First noticed ringing in ears end of Nov 2020 when trying to take temperature; pt could not hear the thermometer beeping. Pt has not been exposed to loud noises, pt has not had any head injury, pt does not think he has ear wax in ears, pt has not tried any new meds, and no dizziness. Pt will keep appt today with Dr Damita Dunnings. Pt has no covid symptoms, no travel and no known exposure to + covid. UC & ED precautions given and pt voiced understanding. FYI to Dr Damita Dunnings.

## 2019-08-10 NOTE — Telephone Encounter (Signed)
Patient contacted the office asking for an appointment. Patient complains of severe ringing in the ears x1 month. Patient denies any ear pain or drainage. Patient denies any COVID sx. Per Dr. Damita Dunnings, ok to schedule patient for in office visit today at 4:00. Will send to [facility] for triage per Dr. Damita Dunnings.

## 2019-08-10 NOTE — Telephone Encounter (Signed)
Noted. Thanks.

## 2019-08-10 NOTE — Patient Instructions (Signed)
Use flonase and gently try to pop you ears.  Update [place] as needed.  Take care.  Glad to see you.

## 2019-08-12 DIAGNOSIS — H6980 Other specified disorders of Eustachian tube, unspecified ear: Secondary | ICD-10-CM | POA: Insufficient documentation

## 2019-08-12 DIAGNOSIS — H6990 Unspecified Eustachian tube disorder, unspecified ear: Secondary | ICD-10-CM | POA: Insufficient documentation

## 2019-08-12 NOTE — Assessment & Plan Note (Signed)
He has 2 possible issues that may or may not be related.  It looks like he has tinnitus along with eustachian tube dysfunction.  Either one or both could be related to Covid, as opposed infectious symptoms.  Discussed options.  He can gently perform Valsalva and start using Flonase in the meantime.  If his eustachian tube dysfunction improves but the tinnitus remains, then we will know their separate issues.  He will update [place] as needed.  Anatomy discussed with patient.

## 2019-09-11 IMAGING — MR MR ELBOW*L* W/O CM
6 series · 40 of 40 positions shown · non-contrast
Comparison: None.

CLINICAL DATA: Concern for biceps tendon injury after lifting
injury 2 weeks ago.

EXAM:
MRI OF THE LEFT ELBOW WITHOUT CONTRAST
TECHNIQUE: Multiplanar, multisequence MR imaging of the elbow was performed. No
intravenous contrast was administered.

[Series 4: T1 · axial · left · 3.5mm · 0.44mm/px · z∈[-98,+64]mm · 10 of 40 slices shown]
[im 1/40]
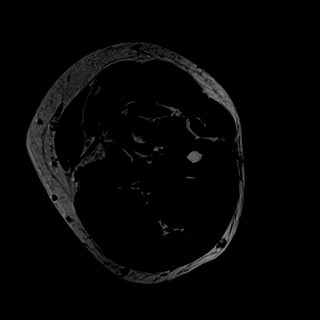
[im 5/40]
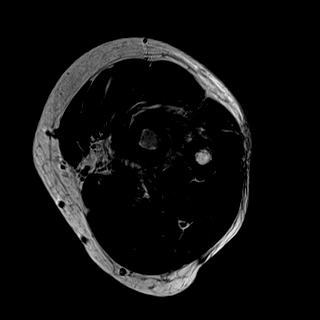
[im 9/40]
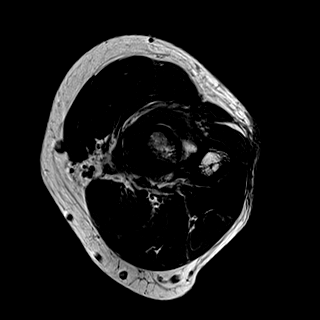
[im 14/40]
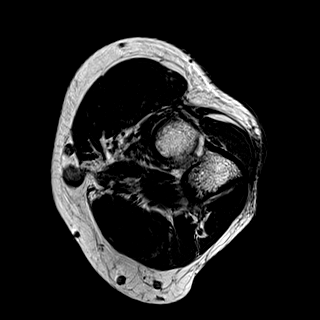
[im 18/40]
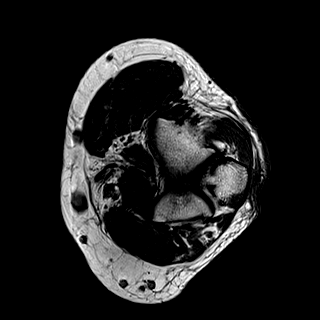
[im 22/40]
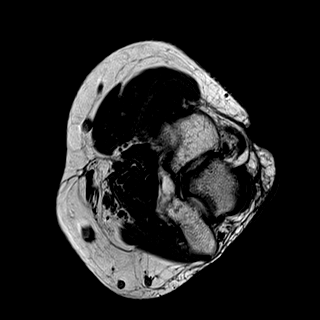
[im 27/40]
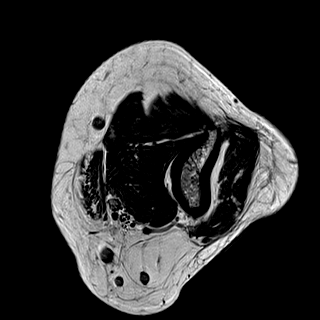
[im 31/40]
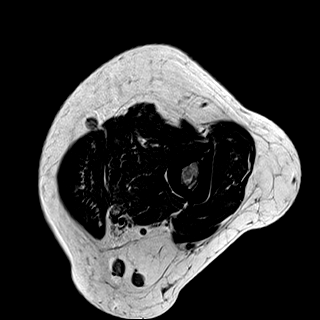
[im 35/40]
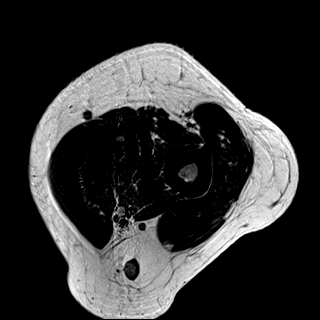
[im 40/40]
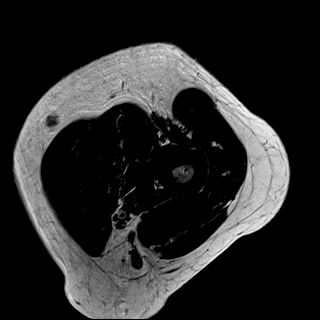

[Series 5: T2 fat-sat · axial · left · 3.5mm · 0.44mm/px · z∈[-98,+64]mm · 10 of 40 slices shown (1 of 2)]
[im 1/40]
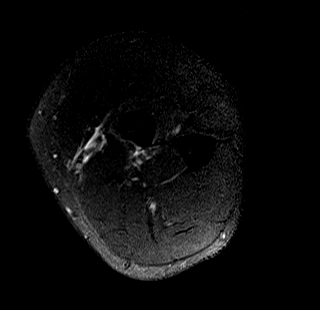
[im 5/40]
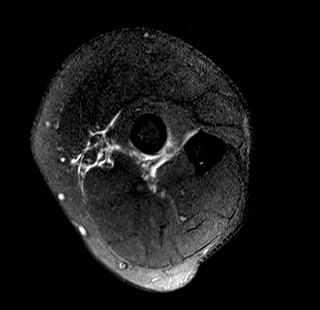
[im 9/40]
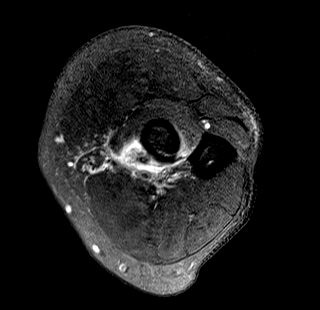
[im 14/40]
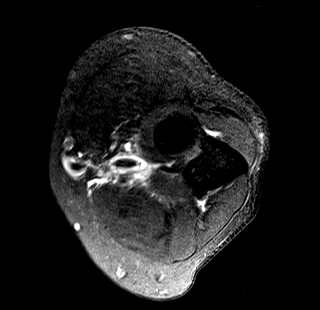
[im 18/40]
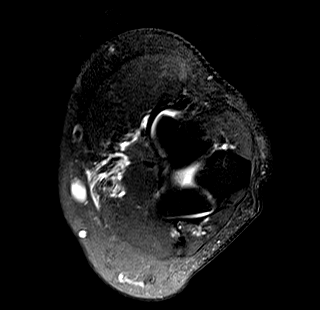
[im 22/40]
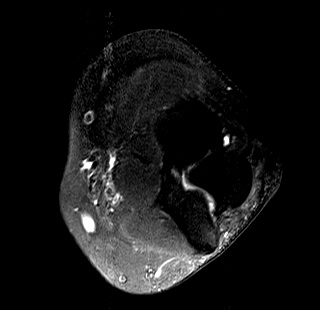
[im 27/40]
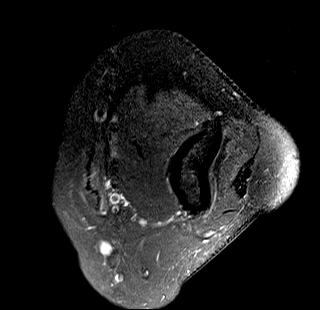
[im 31/40]
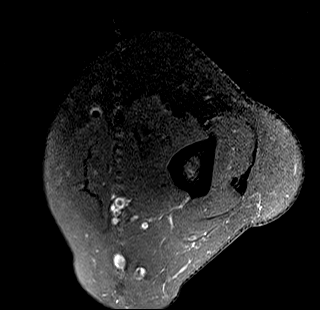
[im 35/40]
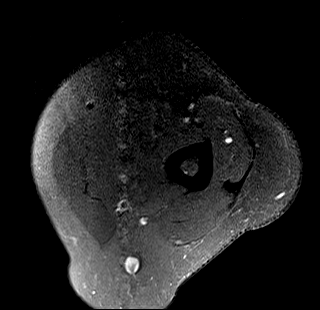
[im 40/40]
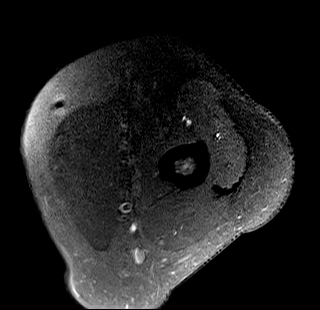

[Series 6: PD fat-sat · oblique · left · 3.5mm · 0.53mm/px · 7 of 28 slices shown]
[im 1/28]
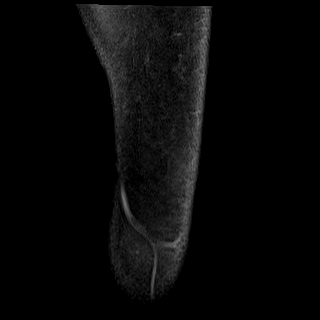
[im 5/28]
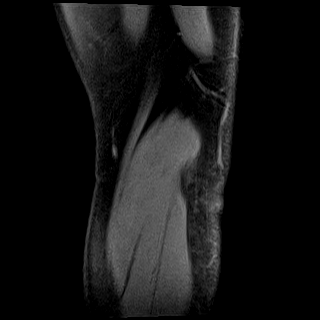
[im 10/28]
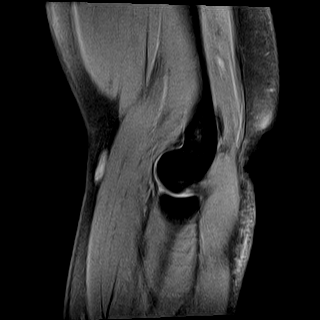
[im 14/28]
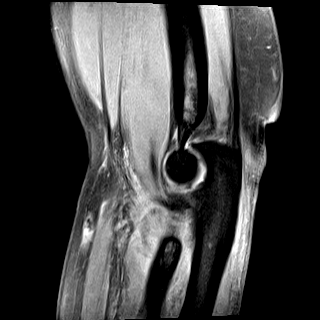
[im 19/28]
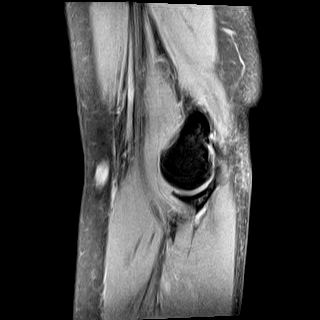
[im 23/28]
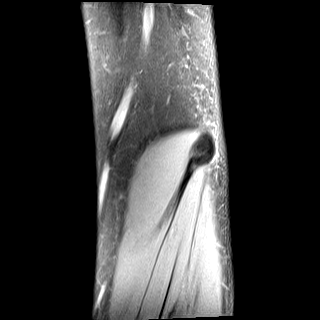
[im 28/28]
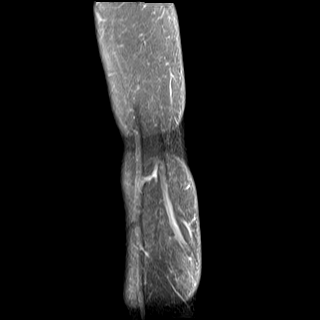

[Series 7: T2 fat-sat · oblique · left · 4.0mm · 0.50mm/px · 5 of 20 slices shown (2 of 2)]
[im 1/20]
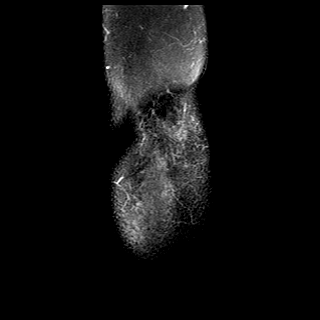
[im 5/20]
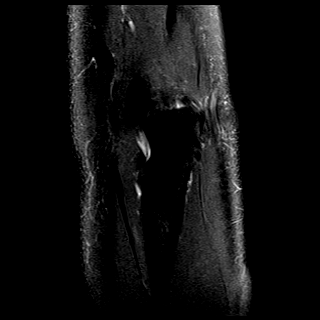
[im 10/20]
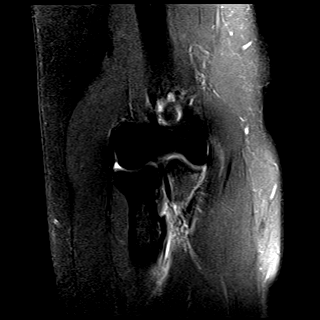
[im 15/20]
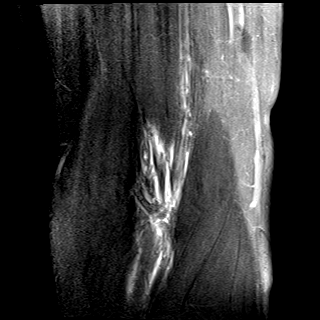
[im 20/20]
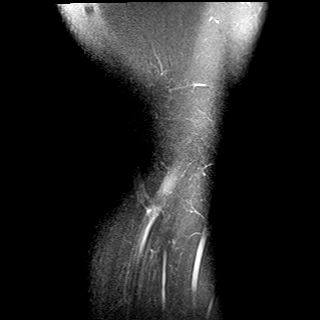

[Series 8: STIR · oblique · left · 4.0mm · 0.66mm/px · 5 of 20 slices shown (1 of 2)]
[im 1/20]
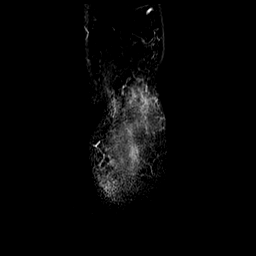
[im 5/20]
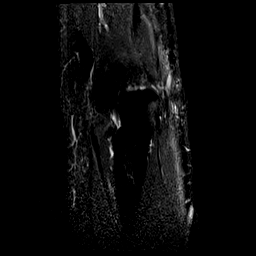
[im 10/20]
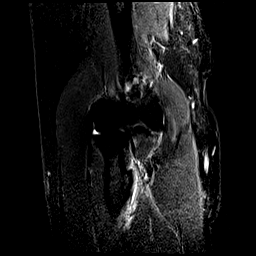
[im 15/20]
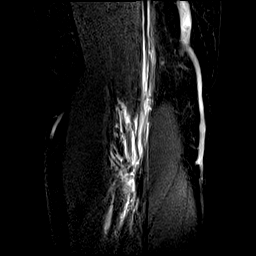
[im 20/20]
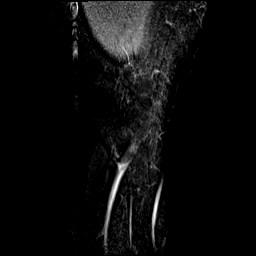

[Series 9: STIR · oblique · left · 4.0mm · 0.70mm/px · 3 of 10 slices shown (2 of 2)]
[im 1/10]
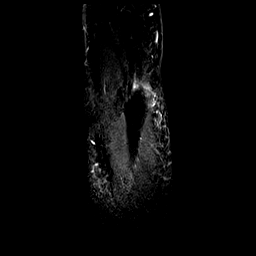
[im 5/10]
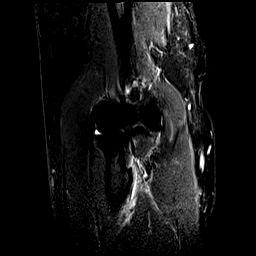
[im 10/10]
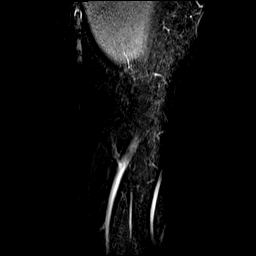

[40 of 40 positions shown; findings below may reference images not displayed]

FINDINGS: TENDONS

Common forearm flexor origin: Intact with normal signal.

Common forearm extensor origin: Intact with normal signal.

Biceps: Probable bifurcated distal biceps tendon with complete tear
of the short head component at the radial tuberosity, with
approximately 8 mm of retraction. The long head tendon fibers appear
intact. There is a small amount of fluid and hemorrhage surrounding
the distal biceps tendon.

Triceps: Intact with normal signal.

LIGAMENTS

Medial stabilizers: Intact.

Lateral stabilizers: The lateral ulnar and radial collateral
ligaments appear intact.

Cartilage: Preserved.  No focal chondral defect demonstrated.

Joint: No joint effusion or loose body observed.

Cubital tunnel: Unremarkable.  The ulnar nerve appears normal.

Bones: No acute or significant extra-articular osseous findings.

Other: None.
IMPRESSION: 1. Probable bifurcated distal biceps tendon with complete tear of
the short head component at the radial tuberosity and approximately
8 mm of retraction. The long head component appears intact.

## 2019-10-05 ENCOUNTER — Other Ambulatory Visit: Payer: Self-pay | Admitting: Family Medicine

## 2020-05-12 ENCOUNTER — Telehealth: Payer: Self-pay | Admitting: Family Medicine

## 2020-05-12 NOTE — Telephone Encounter (Signed)
Yes

## 2020-05-12 NOTE — Telephone Encounter (Signed)
Pt called needed cpx before 06/18/2020 to get $600 discount on health insurance.   Can pt be worked in

## 2020-05-25 ENCOUNTER — Other Ambulatory Visit: Payer: Self-pay | Admitting: Family Medicine

## 2020-05-25 DIAGNOSIS — M1A9XX0 Chronic gout, unspecified, without tophus (tophi): Secondary | ICD-10-CM

## 2020-05-25 DIAGNOSIS — R7303 Prediabetes: Secondary | ICD-10-CM

## 2020-05-25 DIAGNOSIS — Z1159 Encounter for screening for other viral diseases: Secondary | ICD-10-CM

## 2020-05-27 ENCOUNTER — Other Ambulatory Visit (INDEPENDENT_AMBULATORY_CARE_PROVIDER_SITE_OTHER): Payer: BC Managed Care – PPO

## 2020-05-27 ENCOUNTER — Other Ambulatory Visit: Payer: Self-pay

## 2020-05-27 DIAGNOSIS — R7303 Prediabetes: Secondary | ICD-10-CM | POA: Diagnosis not present

## 2020-05-27 DIAGNOSIS — M1A9XX0 Chronic gout, unspecified, without tophus (tophi): Secondary | ICD-10-CM

## 2020-05-27 DIAGNOSIS — Z1159 Encounter for screening for other viral diseases: Secondary | ICD-10-CM

## 2020-05-27 LAB — TSH: TSH: 3.49 u[IU]/mL (ref 0.35–4.50)

## 2020-05-27 LAB — COMPREHENSIVE METABOLIC PANEL
ALT: 29 U/L (ref 0–53)
AST: 18 U/L (ref 0–37)
Albumin: 4 g/dL (ref 3.5–5.2)
Alkaline Phosphatase: 86 U/L (ref 39–117)
BUN: 24 mg/dL — ABNORMAL HIGH (ref 6–23)
CO2: 29 mEq/L (ref 19–32)
Calcium: 8.9 mg/dL (ref 8.4–10.5)
Chloride: 104 mEq/L (ref 96–112)
Creatinine, Ser: 0.83 mg/dL (ref 0.40–1.50)
GFR: 102.86 mL/min (ref >60.00)
Glucose, Bld: 93 mg/dL (ref 70–99)
Potassium: 4.4 mEq/L (ref 3.5–5.1)
Sodium: 139 mEq/L (ref 135–145)
Total Bilirubin: 0.5 mg/dL (ref 0.2–1.2)
Total Protein: 6.8 g/dL (ref 6.0–8.3)

## 2020-05-27 LAB — LIPID PANEL
Cholesterol: 143 mg/dL (ref 0–200)
HDL: 38.5 mg/dL — ABNORMAL LOW (ref >39.00)
LDL Cholesterol: 88 mg/dL (ref 0–99)
NonHDL: 104.53
Total CHOL/HDL Ratio: 4
Triglycerides: 82 mg/dL (ref 0.0–149.0)
VLDL: 16.4 mg/dL (ref 0.0–40.0)

## 2020-05-27 LAB — HEMOGLOBIN A1C: Hgb A1c MFr Bld: 5.7 % (ref 4.6–6.5)

## 2020-05-27 LAB — URIC ACID: Uric Acid, Serum: 8 mg/dL — ABNORMAL HIGH (ref 4.0–7.8)

## 2020-05-28 LAB — HEPATITIS C ANTIBODY
Hepatitis C Ab: NONREACTIVE
SIGNAL TO CUT-OFF: 0.01 (ref <1.00)

## 2020-06-03 ENCOUNTER — Ambulatory Visit (INDEPENDENT_AMBULATORY_CARE_PROVIDER_SITE_OTHER): Payer: BC Managed Care – PPO | Admitting: Family Medicine

## 2020-06-03 ENCOUNTER — Other Ambulatory Visit: Payer: Self-pay

## 2020-06-03 ENCOUNTER — Encounter: Payer: Self-pay | Admitting: Family Medicine

## 2020-06-03 VITALS — BP 118/70 | HR 65 | Temp 97.6°F | Ht 71.0 in | Wt 389.5 lb

## 2020-06-03 DIAGNOSIS — Z1211 Encounter for screening for malignant neoplasm of colon: Secondary | ICD-10-CM | POA: Diagnosis not present

## 2020-06-03 DIAGNOSIS — J4540 Moderate persistent asthma, uncomplicated: Secondary | ICD-10-CM

## 2020-06-03 DIAGNOSIS — R609 Edema, unspecified: Secondary | ICD-10-CM

## 2020-06-03 DIAGNOSIS — M1A9XX0 Chronic gout, unspecified, without tophus (tophi): Secondary | ICD-10-CM

## 2020-06-03 DIAGNOSIS — R7303 Prediabetes: Secondary | ICD-10-CM

## 2020-06-03 DIAGNOSIS — Z0000 Encounter for general adult medical examination without abnormal findings: Secondary | ICD-10-CM

## 2020-06-03 DIAGNOSIS — G4733 Obstructive sleep apnea (adult) (pediatric): Secondary | ICD-10-CM

## 2020-06-03 MED ORDER — FLUTICASONE-SALMETEROL 250-50 MCG/DOSE IN AEPB
INHALATION_SPRAY | RESPIRATORY_TRACT | 11 refills | Status: DC
Start: 2020-06-03 — End: 2020-08-07

## 2020-06-03 MED ORDER — COLCHICINE 0.6 MG PO TABS: 0.6000 mg | ORAL_TABLET | Freq: Every day | ORAL | 3 refills | Status: AC | PRN

## 2020-06-03 NOTE — Progress Notes (Signed)
Patient ID: Brandon Parker, male    DOB: Jan 14, 1971, 49 y.o.   MRN: [id number]  This visit was conducted in person.  BP 118/70 (BP Location: Left Arm, Patient Position: Sitting, Cuff Size: Large)   Pulse 65   Temp 97.6 F (36.4 C) (Temporal)   Ht 5\' 11"  (1.803 m)   Wt (!) 389 lb 8 oz (176.7 kg)   SpO2 96%   BMI 54.32 kg/m    CC: CPE  Subjective:   HPI: Brandon Parker is a 49 y.o. male presenting on 06/03/2020 for Annual Exam   Asthma - marked improvement on wixela inhaler.   65 lb weight loss over pandemic - has fully stopped sodas, potatoes, corn and bread. Cutting out sugars and starches. Doesn't count calories. Has started eating more vegetables.   Planning to start using exercise bike 3d/wk as well as weight lifting 3d/wk.   Had 1st gout flare in 1 year (3 wks ago) to left ankle and lateral foot, this has fully resolved. Attributes to new protein bars he was using.   Ongoing toenail fungus despite funginail 1 month use.   Preventative: Colon cancer screening - discussed. Will refer for colonoscopy  No fmhx prostate problems. No nocturia. Strong stream.  Flu shot - declines.  Tetanus shot - thinks around 2006.  COVID vaccine - discussed, declines. H/o COVID-19 infection 05/2019, did seem to recover well  Non smoker  Alcohol - rare Doesn't use seat belts  Sunscreen use - does not use. No changing moles. Dentist yearly - due Eye exam yearly - due  Caffeine: monster energy 2-3/day  Lives with mother, daughter, son  Stays with GF.  Occupation: new job in [place]  Activity: starting regular exercise routine Diet: good water, better fruits/vegetables, good protein,      Relevant past medical, surgical, family and social history reviewed and updated as indicated. Interim medical history since our last visit reviewed. Allergies and medications reviewed and updated. Outpatient Medications Prior to Visit  Medication Sig Dispense Refill  . albuterol (PROVENTIL  HFA;VENTOLIN HFA) 108 (90 Base) MCG/ACT inhaler INHALE 2 PUFFS INTO THE LUNGS EVERY 6 HOURS AS NEEDED FOR WHEEZING 54 g 1  . colchicine 0.6 MG tablet Take 1 tablet (0.6 mg total) by mouth daily as needed. (may take 2 on first day) 30 tablet 0  . Fluticasone-Salmeterol (ADVAIR DISKUS) 250-50 MCG/DOSE AEPB INHALE 1 PUFF INTO THE LUNGS TWICE DAILY 60 each 3  . fluticasone (FLONASE) 50 MCG/ACT nasal spray Place 2 sprays into both nostrils daily.    . furosemide (LASIX) 20 MG tablet Take 0.5-1 tablets (10-20 mg total) by mouth daily as needed for edema. 30 tablet 3   No facility-administered medications prior to visit.     Per HPI unless specifically indicated in ROS section below Review of Systems  Constitutional: Negative for activity change, appetite change, chills, fatigue, fever and unexpected weight change.  HENT: Negative for hearing loss.   Eyes: Negative for visual disturbance.  Respiratory: Positive for cough (asthma hx). Negative for chest tightness, shortness of breath and wheezing.   Cardiovascular: Negative for chest pain, palpitations and leg swelling.  Gastrointestinal: Negative for abdominal distention, abdominal pain, blood in stool, constipation, diarrhea, nausea and vomiting.  Genitourinary: Negative for difficulty urinating and hematuria.  Musculoskeletal: Negative for arthralgias, myalgias and neck pain.  Skin: Negative for rash.  Neurological: Negative for dizziness, seizures, syncope and headaches.  Hematological: Negative for adenopathy. Does not bruise/bleed easily.  Psychiatric/Behavioral: Negative for dysphoric  mood. The patient is not nervous/anxious.    Objective:  BP 118/70 (BP Location: Left Arm, Patient Position: Sitting, Cuff Size: Large)   Pulse 65   Temp 97.6 F (36.4 C) (Temporal)   Ht 5\' 11"  (1.803 m)   Wt (!) 389 lb 8 oz (176.7 kg)   SpO2 96%   BMI 54.32 kg/m   Wt Readings from Last 3 Encounters:  06/03/20 (!) 389 lb 8 oz (176.7 kg)  08/10/19 (!)  423 lb (191.9 kg)  05/07/19 (!) 427 lb (193.7 kg)      Physical Exam Vitals and nursing note reviewed.  Constitutional:      General: He is not in acute distress.    Appearance: Normal appearance. He is well-developed. He is obese. He is not ill-appearing.  HENT:     Head: Normocephalic and atraumatic.     Right Ear: Hearing, tympanic membrane, ear canal and external ear normal.     Left Ear: Hearing, tympanic membrane, ear canal and external ear normal.  Eyes:     General: No scleral icterus.    Extraocular Movements: Extraocular movements intact.     Conjunctiva/sclera: Conjunctivae normal.     Pupils: Pupils are equal, round, and reactive to light.  Neck:     Thyroid: No thyroid mass or thyromegaly.  Cardiovascular:     Rate and Rhythm: Normal rate and regular rhythm.     Pulses: Normal pulses.          Radial pulses are 2+ on the right side and 2+ on the left side.     Heart sounds: Normal heart sounds. No murmur heard.   Pulmonary:     Effort: Pulmonary effort is normal. No respiratory distress.     Breath sounds: Normal breath sounds. No wheezing, rhonchi or rales.  Abdominal:     General: Abdomen is flat. Bowel sounds are normal. There is no distension.     Palpations: Abdomen is soft. There is no mass.     Tenderness: There is no abdominal tenderness. There is no guarding or rebound.     Hernia: No hernia is present.  Musculoskeletal:        General: Normal range of motion.     Cervical back: Normal range of motion and neck supple.     Right lower leg: No edema.     Left lower leg: No edema.  Lymphadenopathy:     Cervical: No cervical adenopathy.  Skin:    General: Skin is warm and dry.     Findings: No rash.  Neurological:     General: No focal deficit present.     Mental Status: He is alert and oriented to person, place, and time.     Comments: CN grossly intact, station and gait intact  Psychiatric:        Mood and Affect: Mood normal.        Behavior:  Behavior normal.        Thought Content: Thought content normal.        Judgment: Judgment normal.       Results for orders placed or performed in visit on 05/27/20  Hepatitis C antibody  Result Value Ref Range   Hepatitis C Ab NON-REACTIVE NON-REACTI   SIGNAL TO CUT-OFF 0.01 <1.00  Uric acid  Result Value Ref Range   Uric Acid, Serum 8.0 (H) 4.0 - 7.8 mg/dL  Hemoglobin A1c  Result Value Ref Range   Hgb A1c MFr Bld 5.7 4.6 - 6.5 %  TSH  Result Value Ref Range   TSH 3.49 0.35 - 4.50 uIU/mL  Comprehensive metabolic panel  Result Value Ref Range   Sodium 139 135 - 145 mEq/L   Potassium 4.4 3.5 - 5.1 mEq/L   Chloride 104 96 - 112 mEq/L   CO2 29 19 - 32 mEq/L   Glucose, Bld 93 70 - 99 mg/dL   BUN 24 (H) 6 - 23 mg/dL   Creatinine, Ser 0.83 0.40 - 1.50 mg/dL   Total Bilirubin 0.5 0.2 - 1.2 mg/dL   Alkaline Phosphatase 86 39 - 117 U/L   AST 18 0 - 37 U/L   ALT 29 0 - 53 U/L   Total Protein 6.8 6.0 - 8.3 g/dL   Albumin 4.0 3.5 - 5.2 g/dL   GFR 102.86 >60.00 mL/min   Calcium 8.9 8.4 - 10.5 mg/dL  Lipid panel  Result Value Ref Range   Cholesterol 143 0 - 200 mg/dL   Triglycerides 82.0 0 - 149 mg/dL   HDL 38.50 (L) >39.00 mg/dL   VLDL 16.4 0.0 - 40.0 mg/dL   LDL Cholesterol 88 0 - 99 mg/dL   Total CHOL/HDL Ratio 4    NonHDL 104.53    Assessment & Plan:  This visit occurred during the SARS-CoV-2 public health emergency.  Safety protocols were in place, including screening questions prior to the visit, additional usage of staff PPE, and extensive cleaning of exam room while observing appropriate contact time as indicated for disinfecting solutions.   Problem List Items Addressed This Visit    RAD (reactive airway disease) with wheezing, moderate persistent, uncomplicated    Continue regular advair use which has significantly helped dyspnea/cough.       Relevant Medications   Fluticasone-Salmeterol (ADVAIR DISKUS) 250-50 MCG/DOSE AEPB   Prediabetes    A1c has improved with  weight loss.       Peripheral edema    Improvement noted with weight loss.       OSA (obstructive sleep apnea)    Has seen pulm, declined CPAP use.       Obesity, morbid, BMI 50 or higher ([place])    Congratulated on ongoing weight loss. Reviewed diet and lifestyle changes to date. He is motivated to continue this process.       Health maintenance examination - Primary    Preventative protocols reviewed and updated unless pt declined. Discussed healthy diet and lifestyle.       Gout    1 flare in the past year, managed with colchicine PRN.  Discussed elevated urate levels, declines daily allopurinol.       Other Visit Diagnoses    Special screening for malignant neoplasms, colon       Relevant Orders   Ambulatory referral to Gastroenterology       Meds ordered this encounter  Medications  . colchicine 0.6 MG tablet    Sig: Take 1 tablet (0.6 mg total) by mouth daily as needed. (may take 2 on first day)    Dispense:  30 tablet    Refill:  3  . Fluticasone-Salmeterol (ADVAIR DISKUS) 250-50 MCG/DOSE AEPB    Sig: INHALE 1 PUFF INTO THE LUNGS TWICE DAILY    Dispense:  60 each    Refill:  11   Orders Placed This Encounter  Procedures  . Ambulatory referral to Gastroenterology    Referral Priority:   Routine    Referral Type:   Consultation    Referral Reason:   Specialty Services Required  Number of Visits Requested:   1    Patient instructions: We will refer you for colonoscopy in [place].  Congratulations on weight loss! Good to see you today Return as needed or in 1 year for next physical.   Follow up plan: Return in about 1 year (around 06/03/2021) for annual exam, prior fasting for blood work.  Ria Bush, MD

## 2020-06-03 NOTE — Assessment & Plan Note (Addendum)
Congratulated on ongoing weight loss. Reviewed diet and lifestyle changes to date. He is motivated to continue this process.

## 2020-06-03 NOTE — Assessment & Plan Note (Signed)
Improvement noted with weight loss.

## 2020-06-03 NOTE — Assessment & Plan Note (Signed)
1 flare in the past year, managed with colchicine PRN.  Discussed elevated urate levels, declines daily allopurinol.

## 2020-06-03 NOTE — Assessment & Plan Note (Signed)
Preventative protocols reviewed and updated unless pt declined. Discussed healthy diet and lifestyle.  

## 2020-06-03 NOTE — Assessment & Plan Note (Addendum)
A1c has improved with weight loss.

## 2020-06-03 NOTE — Assessment & Plan Note (Signed)
Continue regular advair use which has significantly helped dyspnea/cough.

## 2020-06-03 NOTE — Assessment & Plan Note (Signed)
Has seen pulm, declined CPAP use.

## 2020-06-03 NOTE — Patient Instructions (Addendum)
We will refer you for colonoscopy in [place].  Congratulations on weight loss! Good to see you today Return as needed or in 1 year for next physical.   Health Maintenance, Male Adopting a healthy lifestyle and getting preventive care are important in promoting health and wellness. Ask your health care provider about:  The right schedule for you to have regular tests and exams.  Things you can do on your own to prevent diseases and keep yourself healthy. What should I know about diet, weight, and exercise? Eat a healthy diet   Eat a diet that includes plenty of vegetables, fruits, low-fat dairy products, and lean protein.  Do not eat a lot of foods that are high in solid fats, added sugars, or sodium. Maintain a healthy weight Body mass index (BMI) is a measurement that can be used to identify possible weight problems. It estimates body fat based on height and weight. Your health care provider can help determine your BMI and help you achieve or maintain a healthy weight. Get regular exercise Get regular exercise. This is one of the most important things you can do for your health. Most adults should:  Exercise for at least 150 minutes each week. The exercise should increase your heart rate and make you sweat (moderate-intensity exercise).  Do strengthening exercises at least twice a week. This is in addition to the moderate-intensity exercise.  Spend less time sitting. Even light physical activity can be beneficial. Watch cholesterol and blood lipids Have your blood tested for lipids and cholesterol at 49 years of age, then have this test every 5 years. You may need to have your cholesterol levels checked more often if:  Your lipid or cholesterol levels are high.  You are older than 49 years of age.  You are at high risk for heart disease. What should I know about cancer screening? Many types of cancers can be detected early and may often be prevented. Depending on your health  history and family history, you may need to have cancer screening at various ages. This may include screening for:  Colorectal cancer.  Prostate cancer.  Skin cancer.  Lung cancer. What should I know about heart disease, diabetes, and high blood pressure? Blood pressure and heart disease  High blood pressure causes heart disease and increases the risk of stroke. This is more likely to develop in people who have high blood pressure readings, are of African descent, or are overweight.  Talk with your health care provider about your target blood pressure readings.  Have your blood pressure checked: ? Every 3-5 years if you are 37-57 years of age. ? Every year if you are 41 years old or older.  If you are between the ages of 71 and 61 and are a current or former smoker, ask your health care provider if you should have a one-time screening for abdominal aortic aneurysm (AAA). Diabetes Have regular diabetes screenings. This checks your fasting blood sugar level. Have the screening done:  Once every three years after age 20 if you are at a normal weight and have a low risk for diabetes.  More often and at a younger age if you are overweight or have a high risk for diabetes. What should I know about preventing infection? Hepatitis B If you have a higher risk for hepatitis B, you should be screened for this virus. Talk with your health care provider to find out if you are at risk for hepatitis B infection. Hepatitis C Blood testing  is recommended for:  Everyone born from [birth year] through 1965.  Anyone with known risk factors for hepatitis C. Sexually transmitted infections (STIs)  You should be screened each year for STIs, including gonorrhea and chlamydia, if: ? You are sexually active and are younger than 49 years of age. ? You are older than 49 years of age and your health care provider tells you that you are at risk for this type of infection. ? Your sexual activity has changed since  you were last screened, and you are at increased risk for chlamydia or gonorrhea. Ask your health care provider if you are at risk.  Ask your health care provider about whether you are at high risk for HIV. Your health care provider may recommend a prescription medicine to help prevent HIV infection. If you choose to take medicine to prevent HIV, you should first get tested for HIV. You should then be tested every 3 months for as long as you are taking the medicine. Follow these instructions at home: Lifestyle  Do not use any products that contain nicotine or tobacco, such as cigarettes, e-cigarettes, and chewing tobacco. If you need help quitting, ask your health care provider.  Do not use street drugs.  Do not share needles.  Ask your health care provider for help if you need support or information about quitting drugs. Alcohol use  Do not drink alcohol if your health care provider tells you not to drink.  If you drink alcohol: ? Limit how much you have to 0-2 drinks a day. ? Be aware of how much alcohol is in your drink. In the [place] one drink equals one 12 oz bottle of beer (355 mL), one 5 oz glass of wine (148 mL), or one 1 oz glass of hard liquor (44 mL). General instructions  Schedule regular health, dental, and eye exams.  Stay current with your vaccines.  Tell your health care provider if: ? You often feel depressed. ? You have ever been abused or do not feel safe at home. Summary  Adopting a healthy lifestyle and getting preventive care are important in promoting health and wellness.  Follow your health care provider's instructions about healthy diet, exercising, and getting tested or screened for diseases.  Follow your health care provider's instructions on monitoring your cholesterol and blood pressure. This information is not intended to replace advice given to you by your health care provider. Make sure you discuss any questions you have with your health care  provider. Document Revised: 06/28/2018 Document Reviewed: 06/28/2018 Elsevier Patient Education  2020 [employer].

## 2020-06-16 ENCOUNTER — Encounter: Payer: Self-pay | Admitting: Gastroenterology

## 2020-07-15 ENCOUNTER — Encounter: Payer: Self-pay | Admitting: *Deleted

## 2020-07-15 ENCOUNTER — Telehealth: Payer: Self-pay | Admitting: *Deleted

## 2020-07-15 NOTE — Telephone Encounter (Signed)
Dr Myrtie Neither,  This 49 yo male is scheduled to have a colon with you 08-18-2020- he has a BMI of 54.35 as of 06-03-2020  Med hx as follows - Do you want an OV  Or direct at [facility] ?  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/05/2011  Eustaquio Boyden, MD    Pulmonary nodules Create Overview Unprioritized 08/05/2011  Eustaquio Boyden, MD    Obesity, morbid, BMI 50 or higher ([facility]) Create Overview Unprioritized 06/03/2020  Eustaquio Boyden, MD    Gout Create Overview Unprioritized 06/03/2020  Eustaquio Boyden, MD    Skin rash Create Overview Unprioritized 11/29/2013  Eustaquio Boyden, MD    Nasal congestion Create Overview Unprioritized 04/14/2017  Eustaquio Boyden, MD    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  Eustaquio Boyden, MD    Oerview  nl SHBG, low free and tot T, nl LH/FSH           Skin lesions Create Overview Unprioritized 03/17/2015  Eustaquio Boyden, MD    RAD (reactive airway disease) with wheezing, moderate persistent, uncomplicated Create Overview Unprioritized 06/03/2020  Eustaquio Boyden, MD    Microhematuria Edit Overview Unprioritized 10/26/2016  Eustaquio Boyden, MD    Overview  s/p urology and nephrology evaluation - ?familial thin basement membrane disease         Peripheral edema Edit Overview Unprioritized 06/03/2020  Eustaquio Boyden, MD    Overview  Not renally related per nephrology. ?OSA vs R heart failure - rec f/u with PCP (10/2016)         Exertional dyspnea Edit Overview Unprioritized 04/10/2018  Eustaquio Boyden, MD    Overview  Echo 04/2017 - EF 55-60%, normal wall motion, mildly dilated LA.          Prediabetes Create Overview Unprioritized 06/03/2020  Eustaquio Boyden, MD    OSA (obstructive sleep apnea) Create Overview  Unprioritized 06/03/2020  Eustaquio Boyden, MD    Rupture of left distal biceps tendon Edit Overview Unprioritized 05/03/2018  Eustaquio Boyden, MD    Overview  S/p repair 05/2017 [facility])         Acute pain of left shoulder Create Overview Unprioritized 05/03/2018  Eustaquio Boyden, MD    Fracture of shaft of humerus

## 2020-07-20 NOTE — Telephone Encounter (Signed)
Office visit, please.

## 2020-07-21 NOTE — Telephone Encounter (Signed)
Called pt to explain need to OV-  Pt states he has changed his siet and has lost down to 357lb-  Per the BMI Calculator with this current weight and a height og 5'11"- pt's bmi is 49.8-  RS pt to [facility]- pt will continue to loose weight and will have an in person PV 2-15 to get BMi at that time-  Pt aware if BMI Up, will then have to have an OV

## 2020-08-07 ENCOUNTER — Other Ambulatory Visit: Payer: Self-pay | Admitting: Family Medicine

## 2020-08-07 MED ORDER — ALBUTEROL SULFATE HFA 108 (90 BASE) MCG/ACT IN AERS: INHALATION_SPRAY | RESPIRATORY_TRACT | 1 refills | Status: AC

## 2020-08-07 NOTE — Telephone Encounter (Signed)
Advair Diskus last rx:  06/03/20, #60 Last OV:  06/03/20, CPE Next OV:  none

## 2020-08-07 NOTE — Telephone Encounter (Signed)
Patient's insurance has changed, and needs his inhaler prescription to be sent to [facility] for 3 mo refill, so it will be cheaper.Please advise.

## 2020-08-07 NOTE — Addendum Note (Signed)
Addended by: Brenton Grills on: 12/21/5407 81:19 PM   Modules accepted: Orders

## 2020-08-07 NOTE — Telephone Encounter (Signed)
He needs a prescription for wixela inhub 250/50 inh generic for advair not the albuterol

## 2020-08-08 MED ORDER — FLUTICASONE-SALMETEROL 250-50 MCG/DOSE IN AEPB: INHALATION_SPRAY | RESPIRATORY_TRACT | 3 refills | Status: AC

## 2020-08-08 NOTE — Telephone Encounter (Signed)
Sent in

## 2020-08-18 ENCOUNTER — Encounter: Payer: BC Managed Care – PPO | Admitting: Gastroenterology

## 2020-09-01 ENCOUNTER — Ambulatory Visit: Payer: BC Managed Care – PPO | Admitting: Gastroenterology

## 2020-09-02 ENCOUNTER — Ambulatory Visit (AMBULATORY_SURGERY_CENTER): Payer: Self-pay | Admitting: *Deleted

## 2020-09-02 ENCOUNTER — Other Ambulatory Visit: Payer: Self-pay

## 2020-09-02 VITALS — Ht 71.0 in | Wt 346.0 lb

## 2020-09-02 DIAGNOSIS — Z01818 Encounter for other preprocedural examination: Secondary | ICD-10-CM

## 2020-09-02 DIAGNOSIS — Z1211 Encounter for screening for malignant neoplasm of colon: Secondary | ICD-10-CM

## 2020-09-02 MED ORDER — PLENVU 140 G PO SOLR: 1.0000 | ORAL | 0 refills | Status: DC

## 2020-09-02 NOTE — Progress Notes (Signed)
No egg or soy allergy known to patient  No issues with past sedation with any surgeries or procedures No intubation problems in the past  No FH of Malignant Hyperthermia No diet pills per patient No home 02 use per patient  No blood thinners per patient  Pt denies issues with constipation  No A fib or A flutter  EMMI video to pt or via [place] 19 guidelines implemented in PV today with Pt and RN  Pt is not vaccinated  for Covid -2-23 WED 1 pm covidtest   Pt denies loose or missing teeth, denies dentures, partials, dental implants, capped or bonded teeth  Plenvu  Sample to pt today as he states his insurance will not cover and he cannot afford 150$  Due to the COVID-19 pandemic we are asking patients to follow certain guidelines.  Pt aware of COVID protocols and [facility] guidelines

## 2020-09-10 ENCOUNTER — Other Ambulatory Visit: Payer: Self-pay | Admitting: Gastroenterology

## 2020-09-10 DIAGNOSIS — Z1159 Encounter for screening for other viral diseases: Secondary | ICD-10-CM | POA: Diagnosis not present

## 2020-09-11 ENCOUNTER — Encounter: Payer: Self-pay | Admitting: Gastroenterology

## 2020-09-11 LAB — SARS CORONAVIRUS 2 (TAT 6-24 HRS): SARS Coronavirus 2: NEGATIVE

## 2020-09-15 ENCOUNTER — Ambulatory Visit (AMBULATORY_SURGERY_CENTER): Payer: BC Managed Care – PPO | Admitting: Gastroenterology

## 2020-09-15 ENCOUNTER — Encounter: Payer: Self-pay | Admitting: Gastroenterology

## 2020-09-15 ENCOUNTER — Other Ambulatory Visit: Payer: Self-pay

## 2020-09-15 VITALS — BP 121/71 | HR 64 | Temp 96.5°F | Resp 12 | Ht 71.0 in | Wt 346.0 lb

## 2020-09-15 DIAGNOSIS — D123 Benign neoplasm of transverse colon: Secondary | ICD-10-CM | POA: Diagnosis not present

## 2020-09-15 DIAGNOSIS — Z1211 Encounter for screening for malignant neoplasm of colon: Secondary | ICD-10-CM

## 2020-09-15 DIAGNOSIS — D122 Benign neoplasm of ascending colon: Secondary | ICD-10-CM | POA: Diagnosis not present

## 2020-09-15 MED ORDER — SODIUM CHLORIDE 0.9 % IV SOLN: 500.0000 mL | INTRAVENOUS | Status: DC

## 2020-09-15 NOTE — Op Note (Signed)
[facility] Patient Name: Brandon Parker Procedure Date: 09/15/2020 10:38 AM MRN: [id number] Endoscopist: Mallie Mussel L. Loletha Carrow , MD Age: 50 Referring MD:  Date of Birth: 07/18/1971 Gender: Male Account #: [id number] Procedure:                Colonoscopy Indications:              Screening for colorectal malignant neoplasm, This                            is the patient's first colonoscopy Medicines:                Monitored Anesthesia Care Procedure:                Pre-Anesthesia Assessment:                           - Prior to the procedure, a History and Physical                            was performed, and patient medications and                            allergies were reviewed. The patient's tolerance of                            previous anesthesia was also reviewed. The risks                            and benefits of the procedure and the sedation                            options and risks were discussed with the patient.                            All questions were answered, and informed consent                            was obtained. Prior Anticoagulants: The patient has                            taken no previous anticoagulant or antiplatelet                            agents. ASA Grade Assessment: III - A patient with                            severe systemic disease. After reviewing the risks                            and benefits, the patient was deemed in                            satisfactory condition to undergo the procedure.  After obtaining informed consent, the colonoscope                            was passed under direct vision. Throughout the                            procedure, the patient's blood pressure, pulse, and                            oxygen saturations were monitored continuously. The                            Colonoscope was introduced through the anus and                            advanced to the the cecum,  identified by                            appendiceal orifice and ileocecal valve. The                            colonoscopy was performed without difficulty. The                            patient tolerated the procedure well. The quality                            of the bowel preparation was excellent. The                            ileocecal valve, appendiceal orifice, and rectum                            were photographed. The bowel preparation used was                            Plenvu. Scope In: 10:49:39 AM Scope Out: 11:11:21 AM Scope Withdrawal Time: 0 hours 16 minutes 31 seconds  Total Procedure Duration: 0 hours 21 minutes 42 seconds  Findings:                 A thrombosed external hemorrhoid was found on                            perianal exam. (patient stated it occurred                            yesterday)                           Two flat polyps were found in the ascending colon.                            The polyps were diminutive in size. These polyps  were removed with a cold biopsy forceps. Resection                            and retrieval were complete.                           Two sessile polyps were found in the transverse                            colon. The polyps were 3 to 5 mm in size. These                            polyps were removed with a cold snare. Resection                            and retrieval were complete.                           The exam was otherwise without abnormality on                            direct and retroflexion views. Complications:            No immediate complications. Estimated Blood Loss:     Estimated blood loss was minimal. Impression:               - A single thrombosed external hemorrhoid was found                            on perianal exam.                           - Two diminutive polyps in the ascending colon,                            removed with a cold biopsy forceps. Resected and                             retrieved.                           - Two 3 to 5 mm polyps in the transverse colon,                            removed with a cold snare. Resected and retrieved.                           - The examination was otherwise normal on direct                            and retroflexion views. Recommendation:           - Patient has a contact number available for  emergencies. The signs and symptoms of potential                            delayed complications were discussed with the                            patient. Return to normal activities tomorrow.                            Written discharge instructions were provided to the                            patient.                           - Resume previous diet.                           - Continue present medications.                           - Await pathology results.                           - Repeat colonoscopy is recommended for                            surveillance. The colonoscopy date will be                            determined after pathology results from today's                            exam become available for review.                           - Sitz baths and witch hazel ("Tucks") medicated                            pads until hemorrhoid swelling subsides. Call if no                            improvement in a week (or sooner if pain and                            swelling worsen), as surgical incision may be                            necessary. Rital Cavey L. Loletha Carrow, MD 09/15/2020 11:19:37 AM This report has been signed electronically.

## 2020-09-15 NOTE — Patient Instructions (Signed)
Handouts on polyps and hemorrhoids given to you today Await pathology results on polyps removed  Use sitz baths and witch hazel (Tucks ) medicated pads until hemorrhoidal swelling subsides Call Dr Loletha Carrow if no improvement in a week    YOU HAD AN ENDOSCOPIC PROCEDURE TODAY AT [facility]:   Refer to the procedure report that was given to you for any specific questions about what was found during the examination.  If the procedure report does not answer your questions, please call your gastroenterologist to clarify.  If you requested that your care partner not be given the details of your procedure findings, then the procedure report has been included in a sealed envelope for you to review at your convenience later.  YOU SHOULD EXPECT: Some feelings of bloating in the abdomen. Passage of more gas than usual.  Walking can help get rid of the air that was put into your GI tract during the procedure and reduce the bloating. If you had a lower endoscopy (such as a colonoscopy or flexible sigmoidoscopy) you may notice spotting of blood in your stool or on the toilet paper. If you underwent a bowel prep for your procedure, you may not have a normal bowel movement for a few days.  Please Note:  You might notice some irritation and congestion in your nose or some drainage.  This is from the oxygen used during your procedure.  There is no need for concern and it should clear up in a day or so.  SYMPTOMS TO REPORT IMMEDIATELY:   Following lower endoscopy (colonoscopy or flexible sigmoidoscopy):  Excessive amounts of blood in the stool  Significant tenderness or worsening of abdominal pains  Swelling of the abdomen that is new, acute  Fever of 100F or higher    For urgent or emergent issues, a gastroenterologist can be reached at any hour by calling [phone]. Do not use MyChart messaging for urgent concerns.    DIET:  We do recommend a small meal at first, but then you may  proceed to your regular diet.  Drink plenty of fluids but you should avoid alcoholic beverages for 24 hours.  ACTIVITY:  You should plan to take it easy for the rest of today and you should NOT DRIVE or use heavy machinery until tomorrow (because of the sedation medicines used during the test).    FOLLOW UP: Our staff will call the number listed on your records 48-72 hours following your procedure to check on you and address any questions or concerns that you may have regarding the information given to you following your procedure. If we do not reach you, we will leave a message.  We will attempt to reach you two times.  During this call, we will ask if you have developed any symptoms of COVID 19. If you develop any symptoms (ie: fever, flu-like symptoms, shortness of breath, cough etc.) before then, please call [phone].  If you test positive for Covid 19 in the 2 weeks post procedure, please call and report this information to [place].    If any biopsies were taken you will be contacted by phone or by letter within the next 1-3 weeks.  Please call us at [phone] if you have not heard about the biopsies in 3 weeks.    SIGNATURES/CONFIDENTIALITY: You and/or your care partner have signed paperwork which will be entered into your electronic medical record.  These signatures attest to the fact that that the information above on  your After Visit Summary has been reviewed and is understood.  Full responsibility of the confidentiality of this discharge information lies with you and/or your care-partner.

## 2020-09-15 NOTE — Progress Notes (Signed)
Called to room to assist during endoscopic procedure.  Patient ID and intended procedure confirmed with present staff. Received instructions for my participation in the procedure from the performing physician.  

## 2020-09-15 NOTE — Progress Notes (Signed)
A/ox3, pleased with MAC, report to RN 

## 2020-09-17 ENCOUNTER — Telehealth: Payer: Self-pay

## 2020-09-17 NOTE — Telephone Encounter (Signed)
  Follow up Call-  Call back number 09/15/2020  Post procedure Call Back phone  # [phone]  Permission to leave phone message Yes  Some recent data might be hidden     Patient questions:  Do you have a fever, pain , or abdominal swelling? No. Pain Score  0 *  Have you tolerated food without any problems? Yes.    Have you been able to return to your normal activities? Yes.    Do you have any questions about your discharge instructions: Diet   No. Medications  No. Follow up visit  No.  Do you have questions or concerns about your Care? No.  Actions: * If pain score is 4 or above: No action needed, pain <4. 1. Have you developed a fever since your procedure? no  2.   Have you had an respiratory symptoms (SOB or cough) since your procedure? no  3.   Have you tested positive for COVID 19 since your procedure no  4.   Have you had any family members/close contacts diagnosed with the COVID 19 since your procedure?  no   If yes to any of these questions please route to Joylene John, RN and Joella Prince, RN

## 2020-09-25 ENCOUNTER — Encounter: Payer: Self-pay | Admitting: Gastroenterology

## 2021-01-16 ENCOUNTER — Other Ambulatory Visit: Payer: Self-pay

## 2021-01-16 ENCOUNTER — Encounter: Payer: Self-pay | Admitting: Family Medicine

## 2021-01-16 ENCOUNTER — Ambulatory Visit (INDEPENDENT_AMBULATORY_CARE_PROVIDER_SITE_OTHER): Payer: BC Managed Care – PPO | Admitting: Family Medicine

## 2021-01-16 VITALS — BP 118/80 | HR 68 | Temp 97.6°F | Ht 71.0 in | Wt 325.0 lb

## 2021-01-16 DIAGNOSIS — N478 Other disorders of prepuce: Secondary | ICD-10-CM | POA: Diagnosis not present

## 2021-01-16 DIAGNOSIS — N529 Male erectile dysfunction, unspecified: Secondary | ICD-10-CM

## 2021-01-16 MED ORDER — TADALAFIL 5 MG PO TABS: 2.5000 mg | ORAL_TABLET | Freq: Every day | ORAL | 1 refills | Status: AC | PRN

## 2021-01-16 NOTE — Progress Notes (Signed)
This visit occurred during the SARS-CoV-2 public health emergency.  Safety protocols were in place, including screening questions prior to the visit, additional usage of staff PPE, and extensive cleaning of exam room while observing appropriate contact time as indicated for disinfecting solutions.  ED.  He can get an erection but has trouble with maintaining.  D/w pt about options.  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  He has a ring noted in the midst of erection.  No rash now.  No nitroglycerin use.  Weight loss with cutting out carbs.    Meds, vitals, and allergies reviewed.   ROS: Per HPI unless specifically indicated in ROS section   Nad Ncat Neck supple no LA Rrr Ctab Abd soft, not ttp Scarring noted on foreskin.

## 2021-01-16 NOTE — Patient Instructions (Signed)
Try cialis and we'll call about seeing urology.  Take care.  Glad to see you.

## 2021-01-19 DIAGNOSIS — N478 Other disorders of prepuce: Secondary | ICD-10-CM | POA: Insufficient documentation

## 2021-01-19 DIAGNOSIS — N529 Male erectile dysfunction, unspecified: Secondary | ICD-10-CM | POA: Insufficient documentation

## 2021-01-19 NOTE — Assessment & Plan Note (Signed)
Can try Cialis with routine cautions given to patient.  No nitroglycerin use.  Okay for outpatient follow-up.  Continue with weight loss, encourage diet and exercise.

## 2021-01-19 NOTE — Assessment & Plan Note (Signed)
Routine cautions given to patient.  Refer to urology.

## 2024-04-19 ENCOUNTER — Ambulatory Visit: Payer: Self-pay

## 2024-04-19 NOTE — Telephone Encounter (Signed)
 FYI Only or Action Required?: FYI only for provider.  Patient was last seen in primary care on na.  Called Nurse Triage reporting lumps.  Symptoms began several days ago.  Interventions attempted: Nothing.  Symptoms are: gradually worsening.  Triage Disposition: See Physician Within 24 Hours  Patient/caregiver understands and will follow disposition?: Yes   To UC    Copied from CRM #[id number]. Topic: Clinical - Red Word Triage >> Apr 19, 2024 11:40 AM Carlyon D wrote: Red Word that prompted transfer to Nurse Triage: feels like boils/ lumps on the back right side of his head. Pt states they are tender and painful he had a helmet on and was super uncomfortable and when tilts head the lumps hurt as well Reason for Disposition  [1] Swelling is painful to touch AND [2] no fever  Answer Assessment - Initial Assessment Questions 1. APPEARANCE of SWELLING: What does it look like?     Unknown, can't see them 2. SIZE: How large is the swelling? (e.g., inches, cm; or compare to size of pinhead, tip of pen, eraser, coin, pea, grape, ping pong ball)      3/4 inch by 1 inch, multiple ones 3. LOCATION: Where is the swelling located?     Right side of his head 4. ONSET: When did the swelling start?     Two days ago 5. COLOR: What color is it? Is there more than one color?     unknown 6. PAIN: Is there any pain? If Yes, ask: How bad is the pain? (Scale 1-10; or mild, moderate, severe)       Uncomfortable; denies pain 7. ITCH: Does it itch? If Yes, ask: How bad is the itch?      denies 8. CAUSE: What do you think caused the swelling?     unknown 9 OTHER SYMPTOMS: Do you have any other symptoms? (e.g., fever)     no  Protocols used: Skin Lump or Localized Swelling-A-AH
